# Patient Record
Sex: Male | Born: 1940
Health system: Southern US, Community
[De-identification: ages and names within clinical notes are randomized; demographics above are authoritative.]

## PROBLEM LIST (undated history)

## (undated) DIAGNOSIS — M109 Gout, unspecified: Secondary | ICD-10-CM

## (undated) DIAGNOSIS — N529 Male erectile dysfunction, unspecified: Secondary | ICD-10-CM

## (undated) DIAGNOSIS — Z789 Other specified health status: Secondary | ICD-10-CM

## (undated) DIAGNOSIS — N486 Induration penis plastica: Secondary | ICD-10-CM

## (undated) DIAGNOSIS — E7800 Pure hypercholesterolemia, unspecified: Secondary | ICD-10-CM

## (undated) DIAGNOSIS — H9319 Tinnitus, unspecified ear: Secondary | ICD-10-CM

## (undated) HISTORY — DX: Pure hypercholesterolemia, unspecified: E78.00

## (undated) HISTORY — PX: OTHER SURGICAL HISTORY: SHX169

## (undated) HISTORY — DX: Tinnitus, unspecified ear: H93.19

## (undated) HISTORY — PX: JOINT REPLACEMENT: SHX530

## (undated) HISTORY — DX: Gout, unspecified: M10.9

## (undated) HISTORY — DX: Induration penis plastica: N48.6

## (undated) HISTORY — DX: Male erectile dysfunction, unspecified: N52.9

---

## 2006-06-10 ENCOUNTER — Inpatient Hospital Stay (HOSPITAL_COMMUNITY): Admission: EM | Admit: 2006-06-10 | Discharge: 2006-06-14 | Payer: Self-pay | Admitting: Emergency Medicine

## 2007-08-04 ENCOUNTER — Emergency Department (HOSPITAL_COMMUNITY): Admission: EM | Admit: 2007-08-04 | Discharge: 2007-08-04 | Payer: Self-pay | Admitting: Emergency Medicine

## 2008-07-18 ENCOUNTER — Encounter: Admission: RE | Admit: 2008-07-18 | Discharge: 2008-07-18 | Payer: Self-pay | Admitting: Orthopaedic Surgery

## 2008-08-07 ENCOUNTER — Inpatient Hospital Stay (HOSPITAL_COMMUNITY): Admission: RE | Admit: 2008-08-07 | Discharge: 2008-08-10 | Payer: Self-pay | Admitting: Orthopaedic Surgery

## 2009-11-04 IMAGING — CR DG HIP COMPLETE 2+V*R*
1 series · 1 of 1 positions shown · non-contrast
Comparison: CT the pelvis 07/18/2008

CLINICAL DATA: Right hip hemiarthroplasty

RIGHT HIP - COMPLETE 2+ VIEW

[view not recorded]
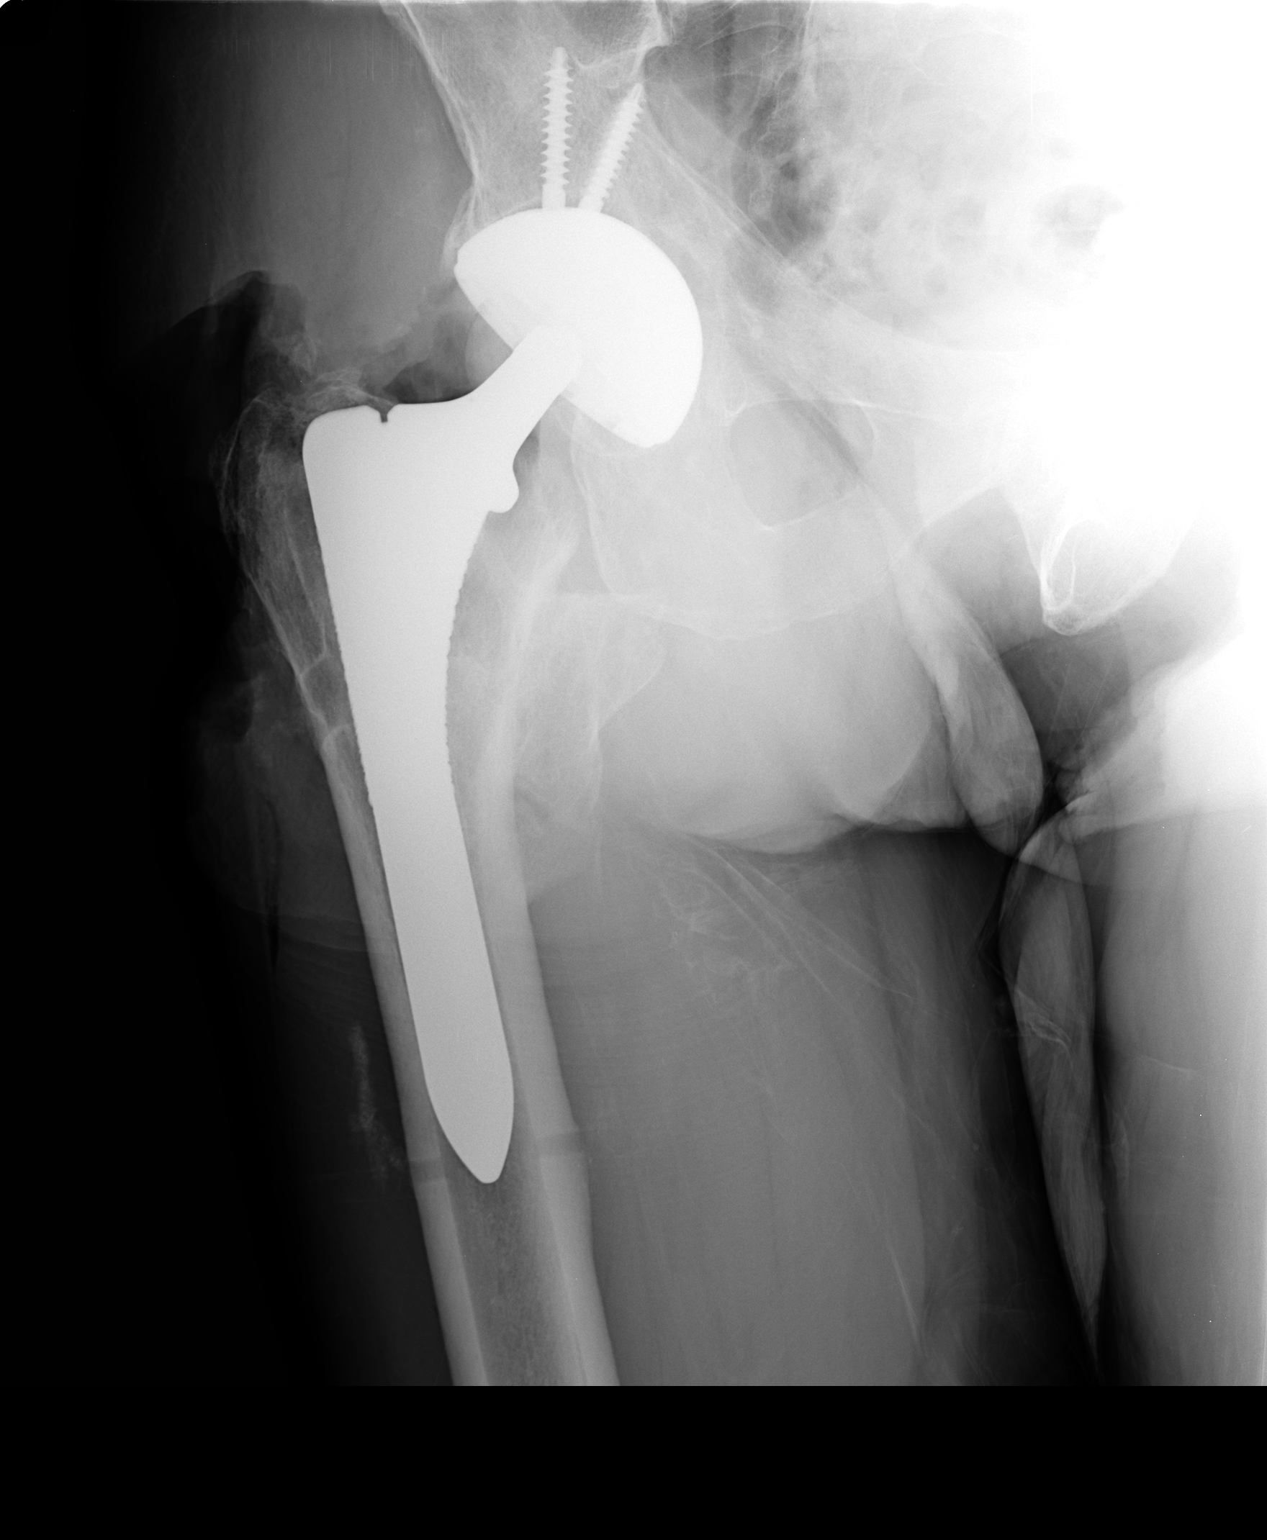

[1 of 1 positions shown; findings below may reference images not displayed]

FINDINGS: Two portable AP views of the right hip were submitted.
There are postsurgical changes of right hip arthroplasty.  Two
screws traverse the acetabulum, into the iliac bone.  Remote healed
intertrochanteric hip fracture deformity is identified.  A tract
from a prior nail in the proximal third of the femur is present.
No evidence of periprosthetic fracture.  The femoral stem component
of the arthroplasty appears well seated.  Expected subcutaneous gas
is noted about the right hip.
IMPRESSION: 1.Postsurgical changes of right hip arthroplasty. No complicating
features.
2.  Remote healed right intertrochanteric fracture deformity.

## 2010-10-27 LAB — CROSSMATCH: Antibody Screen: NEGATIVE

## 2010-10-27 LAB — CBC
HCT: 22.3 % — ABNORMAL LOW (ref 39.0–52.0)
HCT: 27.3 % — ABNORMAL LOW (ref 39.0–52.0)
HCT: 28.9 % — ABNORMAL LOW (ref 39.0–52.0)
HCT: 46.9 % (ref 39.0–52.0)
Hemoglobin: 9.8 g/dL — ABNORMAL LOW (ref 13.0–17.0)
MCHC: 34.9 g/dL (ref 30.0–36.0)
MCHC: 35.6 g/dL (ref 30.0–36.0)
MCV: 90.9 fL (ref 78.0–100.0)
MCV: 93 fL (ref 78.0–100.0)
MCV: 93.7 fL (ref 78.0–100.0)
Platelets: 157 10*3/uL (ref 150–400)
Platelets: 158 10*3/uL (ref 150–400)
Platelets: 212 10*3/uL (ref 150–400)
Platelets: 252 10*3/uL (ref 150–400)
RDW: 13.2 % (ref 11.5–15.5)
RDW: 13.6 % (ref 11.5–15.5)
RDW: 13.7 % (ref 11.5–15.5)

## 2010-10-27 LAB — BASIC METABOLIC PANEL
BUN: 10 mg/dL (ref 6–23)
BUN: 13 mg/dL (ref 6–23)
BUN: 15 mg/dL (ref 6–23)
CO2: 27 mEq/L (ref 19–32)
CO2: 29 mEq/L (ref 19–32)
CO2: 30 mEq/L (ref 19–32)
Calcium: 8.2 mg/dL — ABNORMAL LOW (ref 8.4–10.5)
Calcium: 8.3 mg/dL — ABNORMAL LOW (ref 8.4–10.5)
Chloride: 96 mEq/L (ref 96–112)
Creatinine, Ser: 1.12 mg/dL (ref 0.4–1.5)
GFR calc Af Amer: >60 mL/min (ref >60)
GFR calc non Af Amer: 55 mL/min — ABNORMAL LOW (ref >60)
Glucose, Bld: 128 mg/dL — ABNORMAL HIGH (ref 70–99)
Glucose, Bld: 154 mg/dL — ABNORMAL HIGH (ref 70–99)
Potassium: 4.3 mEq/L (ref 3.5–5.1)
Potassium: 5 mEq/L (ref 3.5–5.1)
Sodium: 132 mEq/L — ABNORMAL LOW (ref 135–145)

## 2010-10-27 LAB — PROTIME-INR: Prothrombin Time: 15.8 seconds — ABNORMAL HIGH (ref 11.6–15.2)

## 2010-11-25 NOTE — Op Note (Signed)
Timothy Stanton, Timothy Stanton               ACCOUNT NO.:  [id number]   MEDICAL RECORD NO.:  [id number]          PATIENT TYPE:  OIB   LOCATION:  5040                         FACILITY:  [facility]   PHYSICIAN:  Lubertha Basque. Dalldorf, M.D.DATE OF BIRTH:  Dec 13, 1940   DATE OF PROCEDURE:  08/07/2008  DATE OF DISCHARGE:                               OPERATIVE REPORT   PREOPERATIVE DIAGNOSIS:  Right hip avascular necrosis.   POSTOPERATIVE DIAGNOSIS:  Right hip avascular necrosis.   PROCEDURES:  1. Right hip removal hardware.  2. Right hip total hip replacement.   ANESTHESIA:  General.   ATTENDING SURGEON:  Lubertha Basque. Jerl Santos, MD   ASSISTANT:  Lindwood Qua, PA   INDICATIONS FOR PROCEDURE:  The patient is a 70 year old man who broke  his hip more than a year ago.  He was treated with a trochanteric nail.  His fracture went onto to heal, but he has persisted with groin pain.  By CT evaluation, he was noted to have collapse of the femoral head  consistent with avascular necrosis, most likely related to his initial  trauma.  He has pain, which limits his ability to rest and walk and  exercise and he is offered a hip replacement.  Informed operative  consent was obtained after discussion of possible complications  including reaction to anesthesia, infection, DVT, dislocation, and  death.   SUMMARY, FINDINGS, AND PROCEDURE:  Under general anesthesia through a  posterior approach and through some separate small incisions, two  procedures were performed.  We first removed the DePuy trochanteric  nail.  We moved the distal screw and the hip screw followed by removal  of the rod, which was fairly difficult, as this had grown over with bone  significantly.  We then performed a hip replacement.  We addressed the  acetabulum with a 60-mm Sector cup.  We did stabilize with 2 screws as  well.  These were 35 mm in length, and we achieved great purchase.  We  placed a +4, 10-degree liner.  On the femoral side, we  had to bypass the  distal screws.  We used an 8-inch, size 18 right Solution stem by DePuy.  We then capped this with a 36, +1.5 hip ball.  He was stable in tested  positions.  We did try to restore the length in his leg, which had been  shortened by the fracture.  Bryna Colander assisted throughout and was  invaluable to the completion of the case in that he helped position and  retract while I performed the procedure.  He also closed simultaneously  to help minimize OR time.   DESCRIPTION OF PROCEDURE:  The patient was taken to the operating suite  where general anesthetic was applied without difficulty.  He was  positioned in the lateral decubitus position with the right hip up.  All  bony prominences were appropriately padded.  Axillary roll was placed,  and hip positioners were utilized.  He was then prepped and draped in  normal sterile fashion.  After the administration of IV Kefzol, we  started the procedure.  All appropriate anti-infective  measures were  used including closed hooded exhaust systems for each member of the  surgical team, Betadine-impregnated drape, and the preoperative IV  antibiotic.  We made some small incisions, removed the locking screw and  the hip screw without significant difficulty.  I did use fluoroscopy  throughout this portion of the case and I read these views myself.  We  then made a posterior approach to the hip.  We split the IT band after  dissection through adipose tissue.  Then, tagged and reflected the short  external rotators.  With some difficulty, we were able to find the  buried trochanteric nail and removed this with several different  instruments.  A femoral neck cut was made after the hip was dislocated.  Attention was turned toward the acetabulum.  Labral structures were  removed followed by reaming medially and reaming up to 57.  We placed a  58 cup, but this did not seem to give [place] adequate fixation.  I  subsequently placed a size 60  Sector cup in appropriate anteversion and  tilt.  This did seem to bite well, but with our previously experience  with a 58, I elected to place 2 screws in a superior direction and  achieved excellent purchase with each.  We then placed a trial liner.  We reamed the femur appropriately in the old intertrochanteric nail  site.  His anatomy had been changed somewhat due to the  intertrochanteric fracture, which healed with abundant callus.  I tried  the Summit stem but did not feel good about my proximal fixation.  Due  to the change in his anatomy, I  elected to go for distal fixation.  We  brought the AML set into the room and reamed appropriately up to an 18.  We placed a 6-inch AML trial and got a x-ray.  The tip of this  prosthesis was right at one of the old screw holes, and I elected to  bypass this with an 8-inch stem.  We subsequently placed an 8-inch right  Solution stem size 18.  We could not antevert this very much, as this  did include a bone for the femur.  With trial reduction, I elected to go  to the 10-degree liner for more stability.  The +4, 10-degree liner was  placed.  We tried various head and neck assemblies and another x-ray to  judge leg length.  I elected to go with a +1.5 hip ball and this was  placed.  He was stable in extension with external rotation and flexion  with internal rotation.  He was fairly tight in full extension.  The  wounds were irrigated followed by reapproximation of the short external  rotators to the greater trochanteric region with nonabsorbable suture.  IT band and gluteus maximus fascia were reapproximated with #1 Vicryl in  interrupted fashion.  We did irrigate prior to closure this layer.  Subcutaneous tissues reapproximated with 0 and 2-0 undyed Vicryl, and  skin was closed with staples.  Adaptic was applied to the various wounds  followed by dry gauze and tape.  Estimated blood loss and intraoperative  fluids obtained from anesthesia  records.   DISPOSITION:  The patient was extubated in the operating room and taken  to the recovery room in stable addition.  He was to be admitted for  Orthopedic Surgery Service for appropriate postop care to include  perioperative antibiotics and Coumadin plus Lovenox for DVT prophylaxis.      Lubertha Basque  Jerl Santos, M.D.  Electronically Signed     PGD/MEDQ  D:  08/07/2008  T:  08/08/2008  Job:  [id number]

## 2010-11-28 NOTE — Op Note (Signed)
Timothy Stanton, Timothy Stanton               ACCOUNT NO.:  [id number]   MEDICAL RECORD NO.:  [id number]          PATIENT TYPE:  INP   LOCATION:  2550                         FACILITY:  [facility]   PHYSICIAN:  Lubertha Basque. Dalldorf, M.D.DATE OF BIRTH:  09-30-1940   DATE OF PROCEDURE:  06/10/2006  DATE OF DISCHARGE:                               OPERATIVE REPORT   PREOPERATIVE DIAGNOSIS:  Right hip intertrochanteric fracture.   POSTOPERATIVE DIAGNOSIS:  Right hip intertrochanteric fracture.   PROCEDURE:  Right hip trochanteric nail.   ANESTHESIA:  General.   ATTENDING SURGEON:  Lubertha Basque. Jerl Santos, M.D.   ASSISTANT:  Lindwood Qua, P.A.   INDICATIONS FOR PROCEDURE:  The patient is a 70 year old man who slipped  today at the gym and sustained a displaced intertrochanteric fracture of  his right hip.  He is offered ORIF in hopes of stabilizing this fracture  and allowing it to heal and potentially to allow him to walk and run  again.  Informed operative consent was obtained after discussion of  possible complications of reaction to anesthesia, infection, DVT, PE,  and death.   SUMMARY OF FINDINGS AND PROCEDURE:  Under general anesthesia, a right  hip intertrochanteric fracture was reduced and then stabilized with a  DePuy intertrochanteric nail.  This was a 130 degree 13 x 200  trochanteric nail stabilized proximally with 115 mm long lag screw and  distally with one locking screw.  Bryna Colander assisted throughout and  was invaluable to the completion of the case in that he helped position  and retract and pass instruments while I performed the operation.  He  also closed simultaneously to help minimize OR time.  I used fluoroscopy  throughout the case to make appropriate intraoperative decisions and  read all these views myself.   DESCRIPTION OF PROCEDURE:  The patient was taken to the operating suite  where general anesthetic was applied without difficulty.  He was  positioned on the  fracture table and appropriately padded.  The fracture  was reduced under fluoroscopic guidance with some gentle traction.  He  was then prepped and draped in normal sterile fashion.  After  administration of preoperative IV Kefzol, a guidewire was placed through  a small incision into the greater trochanteric region.  This was then  over-reamed with a starter reamer.  We then placed the 13 mm x 200 mm  130 degree nail without much difficulty.  I then locked this proximally  through a separate incision using the guide.  This screw was 115 mm in  length.  He had excellent bone quality.  I locked the rod distally with  one locking screw.  Reduction of fracture and placement of hardware was  confirmed fluoroscopically to be appropriate.  The wounds were irrigated  followed by reapproximation of deep tissues with the 0 Vicryl and  subcutaneous tissues with 2-0 undyed Vicryl.  Skin was closed with  staples.  Adaptic was applied to the wounds followed by dry gauze and  tape.  Estimated blood loss and intraoperative fluids can be obtained  from anesthesia records.   DISPOSITION:  The patient was extubated the operating room and taken to  recovery in stable addition.  He was to be admitted for the orthopedic  surgery service for appropriate postop care to include perioperative  antibiotics and Coumadin plus Lovenox for DVT prophylaxis.      Lubertha Basque Jerl Santos, M.D.  Electronically Signed     PGD/MEDQ  D:  06/10/2006  T:  06/11/2006  Job:  [id number]

## 2010-11-28 NOTE — Discharge Summary (Signed)
Timothy Stanton, Timothy Stanton               ACCOUNT NO.:  [id number]   MEDICAL RECORD NO.:  [id number]          PATIENT TYPE:  INP   LOCATION:  5040                         FACILITY:  [facility]   PHYSICIAN:  Lubertha Basque. Dalldorf, M.D.DATE OF BIRTH:  [birth year]   DATE OF ADMISSION:  08/07/2008  DATE OF DISCHARGE:  08/10/2008                               DISCHARGE SUMMARY   PREOPERATIVE DIAGNOSES:  1. 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  2. Hypertension.   DISCHARGE DIAGNOSES:  1. 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  2. Hypertension.  3. Blood loss anemia.   BRIEF HISTORY:  Timothy Stanton is a 70 year old white male who originally  fractured his hip and had a troch nail put in on August 10, 2005.  He  has gone on to have right hip DJD with AVN and having continued  discomfort in his hip when he ambulates and we have done studies that  revealed this and I have discussed with him treatment options that being  conversion to a hip replacement.   PERTINENT LABORATORY DATA AND X-RAY FINDINGS:  Blood was replaced as  necessary. At the time of this dictation, not all laboratory data is  available in this chart, but I can tell you that postoperatively.  On  his first day postop, his hemoglobin was 9.5, INR was 1.2.  On the  second day postop, his hemoglobin 7.9, INR 1.8, and blood was replaced  as necessary on that date.   HOSPITAL COURSE:  He was admitted postoperatively, placed on a variety  of p.o. and IM analgesics for pain including an IV PCA pump reduced-  protocol dose.  He was kept on his home medicines which will be outlined  at the end of this dictation.  Pharmacy for Coumadin and Lovenox DVT  prophylaxis protocol as well as vancomycin as the perioperative  antibiotic.  He was given various additional medications including  ferrous sulfate, oral pain medicines, antiemetics, muscle relaxers.  Foley catheter was used for the first day and then  discontinued, knee-  high TEDs, incentive spirometry, then activity which was helped by  Physical Therapy, touchdown weightbearing.  On the first day postop, his  wound was benign.  His blood pressure was 96/57 and temperature 97.  Lungs were clear.  Abdomen was soft.  Foley catheter was discontinued.  On the second day postop, he had a decreased hemoglobin and was given 2  units of packed RBCs.  Dressing was changed.  Wound was noted to be  benign.  He was voiding fine.  No difficulties with positive bowel  sounds and breath sounds in all lung fields.  On the third day postop,  he was eating, voiding and was discharged home.   DISCHARGE CONDITION:  Improved.   FOLLOWUP:  He will remain on multivitamin, simvastatin 20 mg 1 a day,  and vitamin E and vitamin C daily.  Coumadin dose regulated by pharmacy  for 4 weeks and then when he is at home regulated by [place].  Genev[facility]lander  will also provide home physical therapy.  He may change his  dressing  daily, be touchdown weightbearing on this leg with crutches or walker.  No driving or lifting.  Low-sodium, heart-healthy diet.      Lindwood Qua, P.A.      Lubertha Basque Jerl Santos, M.D.  Electronically Signed    MC/MEDQ  D:  09/11/2008  T:  09/12/2008  Job:  [id number]

## 2010-11-28 NOTE — Discharge Summary (Signed)
NAMEWEN, Timothy Stanton               ACCOUNT NO.:  [id number]   MEDICAL RECORD NO.:  [id number]          PATIENT TYPE:  INP   LOCATION:  5007                         FACILITY:  [facility]   PHYSICIAN:  Timothy Basque. Dalldorf, M.D.DATE OF BIRTH:  1941-02-08   DATE OF ADMISSION:  06/10/2006  DATE OF DISCHARGE:  06/14/2006                               DISCHARGE SUMMARY   ADMITTING DIAGNOSES:  1. Right hip displaced intertrochanteric fracture.  2. Hyperlipidemia.   DISCHARGE DIAGNOSIS:  1. Right hip displaced intertrochanteric fracture.  2. Hyperlipidemia.   OPERATIONS:  Right hip trochanteric nail.   BRIEF HISTORY:  Timothy Stanton was at a local exercise facility the day of  admission to the hospital.  When he was coming out the shower the floor  was wet and he slipped and fell, injuring his right hip.  He was unable  to bear weight and was transported to the hospital, at which time x-rays  noted an intertrochanteric fracture of his right hip.  We had discussed  treatment options with him that being ORIF with the risks of anesthesia,  infection, DVT and possible death.   PERTINENT LABORATORY AND X-RAY FINDINGS:  He had sinus bradycardia on an  EKG and bronchitic changes on his chest x-ray.  WBCs 10.1, hemoglobin  11.4, platelets 189.  Serial INRs were done, as he was on low-dose  Coumadin protocol, the last one 2.5.  Sodium 141, potassium 4.4, glucose  129, BUN 11, creatinine 1.0, calcium 8.7.   COURSE IN THE HOSPITAL:  He was admitted through the emergency room and  taken to the operating room for the above-mentioned operation.  Postoperatively, he was on a reduced dose morphine PCA pump, IV of  lactated Ringers, Ancef 1 gm q.8h. x3 doses, pharmacy for Coumadin  dosing and low-dose DVT prophylaxis and total hip Lovenox protocol to  start the next day at 10 a.m.  He was given oral medications of  Percocet, Tylenol as needed.  He could be touchdown to partial  weightbearing, Reglan for nausea,  knee-high TEDs, incentive spirometry,  muscle relaxers (i.e Robaxin), and follow up laboratory studies were  done.  The first day postoperatively, his vital signs were stable, lungs  were clear, abdomen was soft.  His hematocrit was 36.3, INR 1.1,  potassium 4.4.  The subsequent days in the hospital, he had to work with  a physical therapist.  His dressing was changed the second day  postoperatively.  Foley catheter was discontinued.  His IV and PCA were  discontinued.  His wound was noted to be benign on dressing change.  He  progressed well with physical therapy and was discharged home on  June 14, 2006.   CONDITION ON DISCHARGE:  Improved.   We made arrangements for a hospital bed at home due to his bedroom being  upstairs without access, and he was given 3 prescriptions, Coumadin dose  per pharmacy, which initially started off at 5 mg daily until his INR  would be checked by a home agency, which would also provide physical  therapy, a walker and hospital bed.  Restoril 15  mg  one at night time p.r.n. sleep and Percocet 1 or 2 every 4-6 hours if  needed.  He may change his dressing daily, be partial weightbearing.  If  any sign of infection, he is to call our office, [phone], and we would  see him back in the office in 7-10 days.  His diet is unrestricted.  He  can use crutches or walker to ambulate.      Timothy Stanton, P.A.      Timothy Stanton, M.D.  Electronically Signed    MC/MEDQ  D:  08/10/2006  T:  08/10/2006  Job:  [id number]

## 2011-04-02 LAB — I-STAT 8, (EC8 V) (CONVERTED LAB)
Acid-Base Excess: 2
BUN: 13
Bicarbonate: 24
Chloride: 112
HCT: 48
Hemoglobin: 16.3
Operator id: 272551
Sodium: 141
pCO2, Ven: 29.8 — ABNORMAL LOW

## 2011-04-02 LAB — CBC
Hemoglobin: 15.6
MCHC: 34.8
RBC: 4.92
RDW: 12.7

## 2011-04-02 LAB — DIFFERENTIAL
Basophils Absolute: 0
Basophils Relative: 0
Lymphocytes Relative: 32
Neutro Abs: 3.7
Neutrophils Relative %: 56

## 2011-04-02 LAB — URINE MICROSCOPIC-ADD ON

## 2011-04-02 LAB — URINALYSIS, ROUTINE W REFLEX MICROSCOPIC
Glucose, UA: NEGATIVE
Ketones, ur: NEGATIVE
Leukocytes, UA: NEGATIVE
Protein, ur: NEGATIVE
pH: 5

## 2011-04-02 LAB — POCT I-STAT CREATININE: Creatinine, Ser: 1.2

## 2012-12-12 ENCOUNTER — Other Ambulatory Visit: Payer: Self-pay | Admitting: Gastroenterology

## 2013-01-12 ENCOUNTER — Encounter (HOSPITAL_COMMUNITY): Payer: Self-pay | Admitting: *Deleted

## 2013-01-16 ENCOUNTER — Encounter (HOSPITAL_COMMUNITY): Payer: Self-pay | Admitting: Pharmacy Technician

## 2013-01-31 ENCOUNTER — Ambulatory Visit (HOSPITAL_COMMUNITY)
Admission: RE | Admit: 2013-01-31 | Payer: PRIVATE HEALTH INSURANCE | Source: Ambulatory Visit | Admitting: Gastroenterology

## 2013-01-31 HISTORY — DX: Other specified health status: Z78.9

## 2013-01-31 SURGERY — COLONOSCOPY WITH PROPOFOL
Anesthesia: Monitor Anesthesia Care

## 2014-12-25 ENCOUNTER — Ambulatory Visit (INDEPENDENT_AMBULATORY_CARE_PROVIDER_SITE_OTHER): Payer: PPO | Admitting: Podiatry

## 2014-12-25 ENCOUNTER — Ambulatory Visit (INDEPENDENT_AMBULATORY_CARE_PROVIDER_SITE_OTHER): Payer: PPO

## 2014-12-25 ENCOUNTER — Encounter: Payer: Self-pay | Admitting: Podiatry

## 2014-12-25 VITALS — BP 144/83 | HR 59 | Temp 99.0°F | Resp 14

## 2014-12-25 DIAGNOSIS — M722 Plantar fascial fibromatosis: Secondary | ICD-10-CM

## 2014-12-25 DIAGNOSIS — M7662 Achilles tendinitis, left leg: Secondary | ICD-10-CM | POA: Diagnosis not present

## 2014-12-25 MED ORDER — MELOXICAM 15 MG PO TABS: 15.0000 mg | ORAL_TABLET | Freq: Every day | ORAL | Status: DC

## 2014-12-25 NOTE — Progress Notes (Signed)
   Subjective:    Patient ID: Timothy Stanton, male    DOB: [birth year], 74 y.o.   MRN: [id number]  HPI  This patient presents to the office with painful left foot in his back heel.  [place] painful area in the back left heel.  It improved and then worsened again.  He presents with pain which affects his walking.  He has tried motrin but problems persists.  He presents for evaluatyion and treatment.    Review of Systems  HENT: Positive for hearing loss.        Ringing in ears  Genitourinary: Positive for frequency.  Musculoskeletal:       Muscle pain       Objective:   Physical Exam Objective: Review of past medical history, medications, social history and allergies were performed.  Vascular: Dorsalis pedis and posterior tibial pulses were palpable B/L, capillary refill was  WNL B/L, temperature gradient was WNL B/L   Skin:  No signs of symptoms of infection or ulcers on both feet  Nails: appear healthy with no signs of mycosis or infections  Sensory: Semmes Weinstein monifilament WNL   Orthopedic: Orthopedic evaluation demonstrates all joints distal t ankle have full ROM without crepitus, muscle power WNL B/L.  Red swollen increased temperature at the insertion achilles tendon left foot.  Muscle strength intact.        Assessment & Plan:  Achilles Tendinitis Left foot  IE  Xray  Prescription for Mobic.

## 2015-01-08 ENCOUNTER — Ambulatory Visit: Payer: Self-pay | Admitting: Podiatry

## 2015-09-02 ENCOUNTER — Ambulatory Visit (INDEPENDENT_AMBULATORY_CARE_PROVIDER_SITE_OTHER): Payer: PPO

## 2015-09-02 ENCOUNTER — Encounter: Payer: Self-pay | Admitting: Podiatry

## 2015-09-02 ENCOUNTER — Ambulatory Visit (INDEPENDENT_AMBULATORY_CARE_PROVIDER_SITE_OTHER): Payer: PPO | Admitting: Podiatry

## 2015-09-02 VITALS — BP 146/91 | HR 56 | Resp 18

## 2015-09-02 DIAGNOSIS — M779 Enthesopathy, unspecified: Secondary | ICD-10-CM | POA: Diagnosis not present

## 2015-09-02 DIAGNOSIS — R52 Pain, unspecified: Secondary | ICD-10-CM | POA: Diagnosis not present

## 2015-09-02 MED ORDER — MELOXICAM 15 MG PO TABS: 15.0000 mg | ORAL_TABLET | Freq: Every day | ORAL | Status: DC

## 2015-09-02 MED ORDER — METHYLPREDNISOLONE 4 MG PO TBPK: ORAL_TABLET | ORAL | Status: DC

## 2015-09-02 NOTE — Patient Instructions (Signed)
Start by taking the medrol dose pack (steroid) and once complete can take the anti-inflammatory (mobic). Do not take both at the same time.

## 2015-09-04 ENCOUNTER — Encounter: Payer: Self-pay | Admitting: Podiatry

## 2015-09-04 NOTE — Progress Notes (Signed)
Subjective:     Patient ID: Timothy Stanton, male   DOB: 08/14/1940, 75 y.o.   MRN: [id number]  HPI  75 year old male presents the office with concerns of swelling and pain to the right foot along the first MTPJ which has been ongoing for approximate one week. He states it hurts more on the bottom. He is tried over-the-counter inserts which seem to make the area worse. He denies any recent injury or trauma. No history of gout. He's had no other treatment. No tingling or numbness. No other complaints.  Review of Systems  All other systems reviewed and are negative.      Objective:   Physical Exam General: AAO x3, NAD  Dermatological: Skin is warm, dry and supple bilateral. Nails x 10 are well manicured; remaining integument appears unremarkable at this time. There are no open sores, no preulcerative lesions, no rash or signs of infection present.  Vascular: Dorsalis Pedis artery and Posterior Tibial artery pedal pulses are 2/4 bilateral with immedate capillary fill time. Pedal hair growth present. No varicosities and no lower extremity edema present bilateral. There is no pain with calf compression, swelling, warmth, erythema.   Neruologic: Grossly intact via light touch bilateral. Vibratory intact via tuning fork bilateral. Protective threshold with Semmes Wienstein monofilament intact to all pedal sites bilateral. Patellar and Achilles deep tendon reflexes 2+ bilateral. No Babinski or clonus noted bilateral.   Musculoskeletal:  Tenderness of the first MTPJ on the right side with overlying swelling without any significant erythema. There is mild pain with first MTPJ range of motion. There is no areas of fluctuance or crepitus. No open lesions. No other areas of tenderness bilateral lower extremities.   Gait: Unassisted, Nonantalgic.      Assessment:      75 year old male right first MTPJ capsulitis    Plan:     -X-rays were obtained and reviewed with the patient.  -Treatment options  discussed including all alternatives, risks, and complications -Etiology of symptoms were discussed -Prescribed Medrol Dosepak. Once it is complete he can start meloxicam which are both ordered today. Discussed several injection would wishes to hold off. -Hold off on orthotics of the symptomatic area worse. Continue supportive shoe gear. Impression the first MTPJ. -Follow-up as scheduled. Call if questions concerns meantime.  Celesta Gentile, DPM

## 2015-09-23 ENCOUNTER — Encounter: Payer: Self-pay | Admitting: Podiatry

## 2015-09-23 ENCOUNTER — Ambulatory Visit (INDEPENDENT_AMBULATORY_CARE_PROVIDER_SITE_OTHER): Payer: PPO | Admitting: Podiatry

## 2015-09-23 VITALS — BP 137/89 | HR 52 | Resp 14

## 2015-09-23 DIAGNOSIS — M722 Plantar fascial fibromatosis: Secondary | ICD-10-CM

## 2015-09-23 DIAGNOSIS — M779 Enthesopathy, unspecified: Secondary | ICD-10-CM | POA: Diagnosis not present

## 2015-09-23 DIAGNOSIS — B351 Tinea unguium: Secondary | ICD-10-CM | POA: Diagnosis not present

## 2015-09-23 NOTE — Progress Notes (Signed)
Patient ID: Timothy Stanton, male   DOB: 08-Jul-1941, 75 y.o.   MRN: [id number]  Subjective: 75 year old male presents the office for follow-up evaluation of right first MTPJ capsulitis and pain. He states his last upon of the pain is significantly improved and he has very minimal pain to no pain at all. He gets some occasional discomfort of the big toe views do a lot of walking but greatly improved. After last appointment he started to develop some heel pain as he is walking differently however that has resolved. He was also asking about possible treatment options for nail fungus. Denies any pain or swelling or any drainage from the toenails. Denies any systemic complaints such as fevers, chills, nausea, vomiting. No acute changes since last appointment, and no other complaints at this time.   Objective: AAO x3, NAD DP/PT pulses palpable bilaterally, CRT less than 3 seconds This time there is no tenderness palpation to the right first MTPJ in the edema appears to be greatly improved and there is only small mild trace edema residual. There is no erythema or increase in warmth. No restriction first MTPJ range of motion today. No pain with range of motion. There is no pain to the plantar medial tubercle of the calcaneus at insertion the plantar fascial there is no pain with medial to lateral compression. No other areas of tenderness to bilateral lower extremity is. No areas of pinpoint bony tenderness or pain with vibratory sensation. MMT 5/5, ROM WNL. No edema, erythema, increase in warmth to bilateral lower extremities.  No open lesions or pre-ulcerative lesions.  Nails appear to be somewhat dystrophic, discolored, hypertrophic. There is no tenderness the nails no surrounding redness or drainage. No pain with calf compression, swelling, warmth, erythema  Assessment: Resolving right first MTPJ capsulitis, onychomycosis  Plan: -All treatment options discussed with the patient including all  alternatives, risks, complications.  -Continues once for any recurrence of pain to the right first MTPJ. Continue stretching to the plantar fasciitis which she's been doing. Supportive shoe gear discussed. -Discuss treatment after onychomycosis. He states he will tried over-the-counter topical before proceeding with any other medicines. -Follow-up as needed. -Patient encouraged to call the office with any questions, concerns, change in symptoms.   Celesta Gentile, DPM

## 2015-09-30 DIAGNOSIS — K1370 Unspecified lesions of oral mucosa: Secondary | ICD-10-CM | POA: Diagnosis not present

## 2015-10-04 ENCOUNTER — Ambulatory Visit (INDEPENDENT_AMBULATORY_CARE_PROVIDER_SITE_OTHER): Payer: PPO | Admitting: Podiatry

## 2015-10-04 ENCOUNTER — Other Ambulatory Visit: Payer: Self-pay | Admitting: Podiatry

## 2015-10-04 VITALS — BP 157/84 | HR 79 | Resp 18

## 2015-10-04 DIAGNOSIS — M779 Enthesopathy, unspecified: Secondary | ICD-10-CM

## 2015-10-04 DIAGNOSIS — M109 Gout, unspecified: Secondary | ICD-10-CM | POA: Diagnosis not present

## 2015-10-05 LAB — URIC ACID: URIC ACID, SERUM: 7.4 mg/dL (ref 4.0–7.8)

## 2015-10-05 LAB — C-REACTIVE PROTEIN: CRP: 3.6 mg/dL — ABNORMAL HIGH (ref <0.60)

## 2015-10-05 LAB — SEDIMENTATION RATE: Sed Rate: 11 mm/hr (ref 0–20)

## 2015-10-07 ENCOUNTER — Telehealth: Payer: Self-pay | Admitting: *Deleted

## 2015-10-07 ENCOUNTER — Encounter: Payer: Self-pay | Admitting: Podiatry

## 2015-10-07 NOTE — Progress Notes (Signed)
Patient ID: Timothy Stanton, male   DOB: 23-Dec-1940, 75 y.o.   MRN: [id number]  Subjective: 75 year old male presents the office today. Evaluation of right first MTPJ pain. He states that after last appointment he was doing well and having no pain however the pain didn't start to recur. He believes that he has gout. Since the pain to the first MTPJ started he started getting heel pain which has been resolving. No recent injury or trauma. No tingling or numbness. He has continued with the meloxicam which seems to help. He finish course of Medrol Dosepak. Denies any systemic complaints such as fevers, chills, nausea, vomiting. No acute changes since last appointment, and no other complaints at this time.   Objective: AAO x3, NAD DP/PT pulses palpable bilaterally, CRT less than 3 seconds Protective sensation intact with Simms Weinstein monofilament, There does continue be some mild edema to the right first MTPJ all but does appear to be improved. There is no significant erythema or increase in warmth. There is no pain with first MTPJ range of motion. There is tenderness along the sesamoids plantarly. No other areas of tenderness to bilateral lower extremities. MMT 5/5, ROM WNL. No edema, erythema, increase in warmth to bilateral lower extremities.  No open lesions or pre-ulcerative lesions.  No pain with calf compression, swelling, warmth, erythema  Assessment: First MTPJ capsulitis, sesamoiditis  Plan: -All treatment options discussed with the patient including all alternatives, risks, complications.  -X-rays were obtained and reviewed with the patient. No evidence of acute fracture stress fracture. Likely capsulitis. -If symptoms continue will immobilize for other pathology. -Continue anti-inflammatories for now. -Ordered blood work (Uric acid, ESR, CRP) -I discussed steroid injections and wishes to hold off. Also discussed other treatment options and he wishes to hold off as his symptoms appear  to be improving slowly. -Offloading pads. -Patient encouraged to call the office with any questions, concerns, change in symptoms.   Celesta Gentile, DPM

## 2015-10-08 NOTE — Telephone Encounter (Signed)
I saw him last week and wanted to do an injection or change medication and he declined and said he would continue with the mobic as it did get better then started again.   Can add colchicine 0.6mg  daily x 10 days. Follow-up then. Also, plantar fascia stretching; ice to the heel, wear sneaker.   He can follow-up with Dr. Cannon Kettle for a 2nd opinion if he would like but I am more than happy to continue seeing him.

## 2015-10-08 NOTE — Telephone Encounter (Addendum)
Pt called for blood work results.  Dr. Jacqualyn Posey states slightly elevated CRP, unspecific for gout, but feels it is gout, and pt should continue the meloxicam.  I informed pt of the results and orders, pt states he has been on the meloxicam long enough and he does not feel it is working, and he has been to our office 3 times and he still has not gotten any relief.  10/09/2015-Pt called request explanation of his lab work and what he should be doing next.  I explained that his C-Reactive Protein was elevated and although it was an indicator of inflammation it did not indicate where the inflammation was or the cause, where as high uric acid was an indicator of gout and his was 7.4 with 7.8 being high normal.  Pt asked what we would do as his next form of treatment if he had an appt.  I told pt Dr. Jacqualyn Posey had offered an injection of steroids into the affected joint and putting the antiinflammatory property of the steroid on the specific problem area often helped or adding Colchicine to his therapy with antiinflammatory medication.  I told pt he should continue the stretches and ice to the heel because his two problems would make each other miserable.  Pt states he wants to take the colchicine and Mobic, cancel the scheduled appt and see how he does.  I told pt I would take care of the orders and cancel the appt.  I told pt to call with concerns.

## 2015-10-18 ENCOUNTER — Ambulatory Visit: Payer: PPO | Admitting: Podiatry

## 2015-10-29 ENCOUNTER — Other Ambulatory Visit: Payer: Self-pay | Admitting: Podiatry

## 2015-10-29 NOTE — Telephone Encounter (Signed)
Pt needs to make an appt if continuing pain.

## 2016-01-28 ENCOUNTER — Encounter: Payer: Self-pay | Admitting: Neurology

## 2016-02-18 ENCOUNTER — Encounter: Payer: Self-pay | Admitting: Neurology

## 2016-02-18 ENCOUNTER — Ambulatory Visit (INDEPENDENT_AMBULATORY_CARE_PROVIDER_SITE_OTHER): Payer: PPO | Admitting: Neurology

## 2016-02-18 VITALS — BP 150/72 | HR 78 | Resp 16 | Ht 70.0 in | Wt 180.0 lb

## 2016-02-18 DIAGNOSIS — R253 Fasciculation: Secondary | ICD-10-CM | POA: Diagnosis not present

## 2016-02-18 DIAGNOSIS — G8929 Other chronic pain: Secondary | ICD-10-CM | POA: Diagnosis not present

## 2016-02-18 DIAGNOSIS — G253 Myoclonus: Secondary | ICD-10-CM

## 2016-02-18 DIAGNOSIS — R29898 Other symptoms and signs involving the musculoskeletal system: Secondary | ICD-10-CM

## 2016-02-18 DIAGNOSIS — G5601 Carpal tunnel syndrome, right upper limb: Secondary | ICD-10-CM

## 2016-02-18 DIAGNOSIS — M6289 Other specified disorders of muscle: Secondary | ICD-10-CM | POA: Diagnosis not present

## 2016-02-18 DIAGNOSIS — M542 Cervicalgia: Secondary | ICD-10-CM | POA: Diagnosis not present

## 2016-02-18 NOTE — Patient Instructions (Addendum)
You have have recent extensive blood work. I don't think we need to add any blood work at this time. Nevertheless, would like to proceed with further testing in the form of EMG and nerve conduction velocity test, which is an electrical nerve and muscle test, which we will schedule. We will call you with the results.  We will do a cervical spine (i.e. neck) MRI to look for degenerative changes vs. Cord compression, vs. Herniated disc. We will call you with the results.

## 2016-02-18 NOTE — Progress Notes (Signed)
Subjective:    Patient ID: [name] is a 75 y.o. male.  HPI     Star Age, MD, PhD [facility] [address] [place], [place] [address]  Dear Dr. Inda Merlin,   I saw your patient, Timothy Stanton, upon your kind request in my neurologic clinic today for initial consultation of his muscle twitching and weakness in his right hand, concern for fasciculations. The patient is accompanied by his wife today. As you know, Timothy Stanton is a 75 year old right-handed gentleman with an underlying medical history of hyperlipidemia, hearing loss, mitral valve prolapse, ED, degenerative joint disease, diverticulosis, tinnitus, and remote history of shingles of the right shoulder in the 1990s, arthritis, status post hip surgery including total hip replacement, who reports muscle cramps in various areas of his body for the past few years. Also smaller muscle twitching around different areas of his body including thighs, calves, forearms, biceps areas. He has noted weakness in his right hand, and problems with dexterity, noticed muscle wasting in his right hand primarily. He has no history of fall, no head injury, no TIA type symptoms, denies shortness of breath, chest pain, swallowing difficulty speech difficulty, memory loss. He reports difficulty with sleep which has been a chronic, nearly lifelong problem. He used to snore but does not snore as much any longer according to wife, he has intentionally been working on weight loss. He has reduced his alcohol intake. He has never been a heavy drinker. He has no history of diabetes. Family history is significant for polio and his older brother who is now 75 years old and has also diabetes. His younger brother is 75 years old and has a history of childhood epilepsy and was apparently recently diagnosed with Parkinson's disease. His father lived to be into his 25s and died of complications of his kidney disease. His mother lived  to be in her late 75s and died of complications of choking. She also was diagnosed with dementia. He has 3 children from his prior marriage, son 75, son 67, daughter 75. He is active and spends a lot of time in the mountains, is physically quite active and out in the woods, mowing the lawn etc. He tries to drink enough water but may not always do a good enough job with that he admits. He likes iced tea about 2 glasses per day, does not drink coffee, and does not drink alcohol daily. His wife is noted an abnormal neck tilt or tendency to look down when he walks, he has noted some neck stiffness and some pain in his neck for the past year. He has no tremors. Symptoms started gradually and he noticed cramping when he had coughing, this would result in severe cramping under his rib cage. Sometimes he has nocturnal cramps in his legs. He does not have a tendency to stagger and has not fallen recently. Symptoms have been progressive but lately he feels he may have plateaued. He does have a history of diplopia for which he was given prisms, this improved. He wears prism eye glasses on the right. I reviewed your office note from 01/28/2016 which you kindly included. Recent laboratory tests in your office included testosterone level, CMP, CBC with differential, PSA, aldolase, CK, sedimentation rate, CRP, SPEP, TSH, lipid panel, and EKG. I reviewed lab test results from your office which included normal CBC with differential with the exception of hemoglobin of 17.5 and hematocrit borderline at 52.9 elevated. Sedimentation rate was 1,  CRP 1.8, CK 113, other results pending, we will request the remaining test results from your office.  His Past Medical History Is Significant For: Past Medical History:  Diagnosis Date  . ED (erectile dysfunction)   . Gout   . Hypercholesteremia   . Medical history non-contributory   . Peyronie disease   . Tinnitus     His Past Surgical History Is Significant For: Past Surgical  History:  Procedure Laterality Date  . JOINT REPLACEMENT Right 4 yrs ago  . right hip pinning   6 1/2 yrs ago    His Family History Is Significant For: Family History  Problem Relation Age of Onset  . Mental illness Mother   . Renal Disease Father     His Social History Is Significant For: Social History   Social History  . Marital status: Married    Spouse name: N/A  . Number of children: 3  . Years of education: HS   Occupational History  . Retired    Social History Main Topics  . Smoking status: Former Research scientist (life sciences)  . Smokeless tobacco: Never Used     Comment: Quit [id number]  . Alcohol use Yes     Comment: wine 1 -2 glasses day  . Drug use: No  . Sexual activity: Not Asked   Other Topics Concern  . None   Social History Narrative   Drinks 7-10 cups of tea a week     His Allergies Are:  Allergies  Allergen Reactions  . Penicillins Other (See Comments)    Fever yrs ago  :   His Current Medications Are:  Outpatient Encounter Prescriptions as of 02/18/2016  Medication Sig  . aspirin EC 81 MG tablet Take 81 mg by mouth daily.  . Cholecalciferol (VITAMIN D3) 2000 units TABS Take by mouth.  . Coenzyme Q10 100 MG capsule Take 100 mg by mouth daily.  . Multiple Vitamin (MULTIVITAMIN WITH MINERALS) TABS Take 1 tablet by mouth daily.  . sildenafil (VIAGRA) 100 MG tablet Take 100 mg by mouth daily as needed for erectile dysfunction.  . [DISCONTINUED] simvastatin (ZOCOR) 40 MG tablet Take 40 mg by mouth daily. 40 mg tablet every other day   No facility-administered encounter medications on file as of 02/18/2016.   :   Review of Systems:  Out of a complete 14 point review of systems, all are reviewed and negative with the exception of these symptoms as listed below:  Review of Systems  Constitutional: Positive for fatigue.  Neurological:       Patient reports that he has had whole body muscle cramps for the past couple of years. They are not any worse, he is curious to know  the cause.  Some muscle weakness reported.     Objective:  Neurologic Exam  Physical Exam Physical Examination:   Vitals:   02/18/16 1047  BP: (!) 150/72  Pulse: 78  Resp: 16    General Examination: The patient is a very pleasant 75 y.o. male in no acute distress. He appears well-developed and well-nourished and well groomed.   HEENT: Normocephalic, atraumatic, pupils are equal, round and reactive to light and accommodation. Funduscopic exam is normal with sharp disc margins noted. Extraocular tracking is good without limitation to gaze excursion or nystagmus noted. Normal smooth pursuit is noted. Hearing is grossly intact. Tympanic membranes are clear bilaterally. Face is symmetric with normal facial animation and normal facial sensation. Speech is clear with no dysarthria noted. There is no hypophonia. There is  no lip, neck/head, jaw or voice tremor. Neck is Fairly supple, maybe mild rigidity noted, some decrease in active neck turned to the left, mild forward tilt in the neck, no carotid bruits on auscultation. Oropharynx exam reveals: mild mouth dryness, adequate dental hygiene. Tongue protrudes centrally and palate elevates symmetrically.    Chest: Clear to auscultation without wheezing, rhonchi or crackles noted.  Heart: S1+S2+0, regular and normal without murmurs, rubs or gallops noted.   Abdomen: Soft, non-tender and non-distended with normal bowel sounds appreciated on auscultation.  Extremities: There is no pitting edema in the distal lower extremities bilaterally. Pedal pulses are intact. He does have thenar wasting and mild intrinsic hand muscle wasting bilaterally, right more prominent than left.  Skin: Warm and dry without trophic changes noted. There are no varicose veins. Feet and toes are slightly purplish in color, slightly cooler to touch, but he has good pulses.  Musculoskeletal: exam reveals no obvious joint deformities, tenderness or joint swelling or erythema.    Neurologically:  Mental status: The patient is awake, alert and oriented in all 4 spheres. His immediate and remote memory, attention, language skills and fund of knowledge are appropriate. There is no evidence of aphasia, agnosia, apraxia or anomia. Speech is clear with normal prosody and enunciation. Thought process is linear. Mood is normal and affect is normal.  Cranial nerves II - XII are as described above under HEENT exam. In addition: shoulder shrug is normal with equal shoulder height noted. Motor exam: Overall fairly lean or thin bulk noted even, thigh muscles appear thinner but he does not believe he has lost any muscle bulk in his thighs, he does have muscle wasting in his thenar muscles and intrinsic hand muscles on the right, tone is fairly normal Normal bulk, strength and tone is noted. There is no drift, tremor or rebound. Romberg is negative. He has fasciculations in both thigh muscles, both hamstring muscles, in the calves bilaterally, and the biceps areas. Sometimes more wider spread muscle contractions almost like myokymia?  Reflexes are 3+ in the biceps, 2+ in the brachial radialis bilaterally, 2-3+ in both knees, trace or absent in both ankles. Toes are equivocal bilaterally. Fine motor skills and coordination: intact with normal finger taps, normal hand movements, normal rapid alternating patting, normal foot taps and normal foot agility.  Cerebellar testing: No dysmetria or intention tremor on finger to nose testing. Heel to shin is unremarkable bilaterally. There is no truncal or gait ataxia.  Sensory exam: intact to light touch, pinprick, vibration, temperature sense in the upper and lower extremities.  Gait, station and balance: He stands with no difficulty. No veering to one side is noted. No leaning to one side is noted. Posture is age-appropriate and stance is narrow based. Gait shows normal stride length and normal pace. No problems turning are noted. He does have a tendency  to look down and neck is tilted more forward when walking, otherwise no posture problems, arm swing is preserved. Tandem walk is difficult for him.              Assessment and Plan:   In summary, Timothy Stanton is a very pleasant 75 y.o.-year old male with an underlying medical history of hyperlipidemia, hearing loss, mitral valve prolapse, ED, degenerative joint disease, diverticulosis, tinnitus, and remote history of shingles of the right shoulder in the 1990s, arthritis, status post hip surgery including total hip replacement, who presents for initial consultation of an approximately 2 year history of involuntary muscle  twitching in keeping with fasciculations with questionable myokymia noted, as well as muscle cramps, mild weakness noted in his hand on the right. On examination, he does have evidence of fasciculations, variable findings on reflex testing, equivocal Babinski, mild weakness in his right arm and hand, no sensory findings, no lateralization otherwise, no parkinsonism per se, no evidence of mood disorder or memory disorder. Differential diagnosis includes cervical cord compression, widespread radiculopathy, myopathy, motor neuron disease, benign fasciculations, benign cramping etc. I explained to the patient that we need to dig a little deeper, I would like to proceed with a cervical spine MRI and EMG and nerve conduction testing. Also explained to him that we may have to do more specific additional testing such as muscle biopsy, more specialized neuromuscular input. We will call with test results in the interim, I will see him back routinely in a couple of months, sooner as needed. I answered all their questions today and the patient and his wife were in agreement. He was reminded to continue to maintain a healthy lifestyle, consider increase water intake.  Thank you very much for allowing me to participate in the care of this nice patient. If I can be of any further assistance to you please do  not hesitate to call me at [phone].  Sincerely,   Star Age, MD, PhD

## 2016-03-10 ENCOUNTER — Ambulatory Visit
Admission: RE | Admit: 2016-03-10 | Discharge: 2016-03-10 | Disposition: A | Discharge status: Home / Self Care | Payer: PPO | Source: Ambulatory Visit | Attending: Neurology | Admitting: Neurology

## 2016-03-10 DIAGNOSIS — G5601 Carpal tunnel syndrome, right upper limb: Secondary | ICD-10-CM

## 2016-03-10 DIAGNOSIS — R253 Fasciculation: Secondary | ICD-10-CM | POA: Diagnosis not present

## 2016-03-10 DIAGNOSIS — G253 Myoclonus: Secondary | ICD-10-CM

## 2016-03-10 DIAGNOSIS — G8929 Other chronic pain: Secondary | ICD-10-CM

## 2016-03-10 DIAGNOSIS — R29898 Other symptoms and signs involving the musculoskeletal system: Secondary | ICD-10-CM

## 2016-03-10 DIAGNOSIS — M542 Cervicalgia: Secondary | ICD-10-CM

## 2016-03-10 MED ORDER — GADOBENATE DIMEGLUMINE 529 MG/ML IV SOLN
17.0000 mL | Freq: Once | INTRAVENOUS | Status: AC | PRN
  Administered 2016-03-10: 17 mL via INTRAVENOUS

## 2016-03-12 ENCOUNTER — Telehealth: Payer: Self-pay

## 2016-03-12 NOTE — Progress Notes (Signed)
C spine MRI showed mild degenerative changes at C3-4 and C4-5 with mild nerve spinal canal and left-sided nerve root canal tightness, but nothing to explain his widespread muscle twitching, nothing to warrant surgical consultation. Please notify patient or spouse. Star Age, MD, PhD [facility])

## 2016-03-12 NOTE — Telephone Encounter (Signed)
I spoke to patient and he is aware of results and recommendations.  

## 2016-03-12 NOTE — Telephone Encounter (Signed)
-----   Message from Star Age, MD sent at 03/12/2016  7:54 AM EDT ----- C spine MRI showed mild degenerative changes at C3-4 and C4-5 with mild nerve spinal canal and left-sided nerve root canal tightness, but nothing to explain his widespread muscle twitching, nothing to warrant surgical consultation. Please notify patient or spouse. Star Age, MD, PhD [facility])

## 2016-03-27 ENCOUNTER — Ambulatory Visit (INDEPENDENT_AMBULATORY_CARE_PROVIDER_SITE_OTHER): Payer: PPO | Admitting: Neurology

## 2016-03-27 ENCOUNTER — Ambulatory Visit (INDEPENDENT_AMBULATORY_CARE_PROVIDER_SITE_OTHER): Payer: Self-pay | Admitting: Neurology

## 2016-03-27 DIAGNOSIS — R531 Weakness: Secondary | ICD-10-CM

## 2016-03-27 DIAGNOSIS — R29898 Other symptoms and signs involving the musculoskeletal system: Secondary | ICD-10-CM

## 2016-03-27 DIAGNOSIS — G253 Myoclonus: Secondary | ICD-10-CM

## 2016-03-27 DIAGNOSIS — M542 Cervicalgia: Secondary | ICD-10-CM

## 2016-03-27 DIAGNOSIS — R253 Fasciculation: Secondary | ICD-10-CM | POA: Diagnosis not present

## 2016-03-27 DIAGNOSIS — G8929 Other chronic pain: Secondary | ICD-10-CM

## 2016-03-27 DIAGNOSIS — M6250 Muscle wasting and atrophy, not elsewhere classified, unspecified site: Secondary | ICD-10-CM

## 2016-03-27 DIAGNOSIS — G5601 Carpal tunnel syndrome, right upper limb: Secondary | ICD-10-CM

## 2016-03-27 DIAGNOSIS — Z0289 Encounter for other administrative examinations: Secondary | ICD-10-CM

## 2016-03-27 NOTE — Procedures (Addendum)
   NCS (NERVE CONDUCTION STUDY) WITH EMG (ELECTROMYOGRAPHY) REPORT   STUDY DATE: March 27 2016 PATIENT NAME: Timothy Stanton DOB: 02-21-1941 MRN: [id number]    TECHNOLOGIST: Laretta Alstrom ELECTROMYOGRAPHER: Marcial Pacas M.D.  CLINICAL INFORMATION:  75 years old right-handed male, presenting with a year history of pain is muscle atrophy, fasciculations and weakness, he also developed neck.  On examination: There was no significant tongue muscle atrophy, but there was noticeable fasciculations, mild tongue protrusion into cheek muscle weakness. He has mild neck flexion weakness. There was frequent muscle fasciculations involving bilateral biceps, triceps, proximal thigh muscles. He has mild bilateral shoulder abduction, external rotation weakness. He has moderate right intrinsic hand muscle atrophy, mild right finger abduction and grip weakness. There was no significant bilateral lower extremity proximal and distal muscle weakness. He has hyperreflexia with bilateral Babinski signs. Sensory examination was intact to light touch pinprick and vibratory sensations.   FINDINGS: NERVE CONDUCTION STUDY: Left peroneal sensory response was normal. Bilateral ulnar sensory responses were normal. Bilateral median sensory responses showed mild to moderately prolonged peak latency, with well-preserved snap amplitude.  Left tibial, peroneal to EDB motor responses were normal.  Left ulnar motor responses were normal. Bilateral median motor responses showed moderately prolonged distal latency, and F-wave latency, with moderately decreased C map amplitude, conduction velocities.  Right ulnar motor responses showed moderately prolonged distal latency, with mildly prolonged F-wave latency, with mildly decreased C map amplitude, conduction velocities was normal.  There was no evidence of temporal dispersion.   NEEDLE ELECTROMYOGRAPHY: Extensive needle examination was performed at bilateral upper, lower  extremity muscles, right cervical, right lumbar sacral, right thoracic paraspinal muscles, right sternocleidomastoid, left genioglossus.  Bilateral biceps, triceps, deltoid: Increased insertional activity, 1 plus spontaneous activity, frequent muscle fasciculations, enlarged complex motor unit potentials, with decreased recruitment patterns.   Right first dorsal interossei, extensor digitorum communis: Increased insertional activity, frequent muscle fasciculations, 1-2 plus spontaneous activity, enlarged complex motor unit potential with decreased recruitment patterns.  There was no spontaneous activity at bilateral cervical paraspinal muscles, at bilateral C5-6 and 7, the paraspinal muscles has complex morphology.  Bilateral tibialis anterior, medial gastrocnemius, vastus lateralis: Increased insertional activity, 1-2 plus spontaneous activity, frequent muscle fasciculations enlarged motor unit potential with decreased recruitment patterns.  There was no spontaneous activity at right lumbosacral paraspinal muscles, right L4-5, S1  Right sternocleidomastoid, normal insertion activity, no spontaneous activity, no fasciculation notice, mild complicated morphology motor unit potential, with mildly decreased recruitment patterns.  Left genioglossus: Normal insertion activity, no spontaneous activity, no fasciculation detected, normal morphology motor unit potential, with mildly decreased recruitment patterns.  Right thoracic paraspinals T 9, T10:  Increased insertional activity, 2 plus spontaneous activity, complex motor unit potential.   IMPRESSION:   This is a marked abnormal study. There is widespread active neuropathic changes involving bilateral cervical, lumbar sacral, right thoracic myotomes. Milder chronic neuropathic changes was noticed at right bulbar myotome. 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    INTERPRETING PHYSICIAN:   Marcial Pacas M.D. Ph.D. [facility] [address] [place], [place] [address] [phone]

## 2016-03-27 NOTE — Progress Notes (Signed)
Please see EMG nerve conduction study under the procedure section

## 2016-03-30 ENCOUNTER — Telehealth: Payer: Self-pay

## 2016-03-30 NOTE — Telephone Encounter (Signed)
I spoke to patient and he is aware of message below. Patient was able to come in on Thursday to discuss results.

## 2016-03-30 NOTE — Telephone Encounter (Signed)
-----   Message from Star Age, MD sent at 03/30/2016 11:39 AM EDT ----- I reviewed his recent EMG and nerve conduction test results and also talked to Dr. Krista Blue about his test. I would like to ask him to come in for an appointment so we can discuss test results and further diagnostic tests and management strategies. pls call patient or wife to make appt.  Star Age, MD, PhD [facility])

## 2016-03-30 NOTE — Progress Notes (Signed)
I reviewed his recent EMG and nerve conduction test results and also talked to Dr. Krista Blue about his test. I would like to ask him to come in for an appointment so we can discuss test results and further diagnostic tests and management strategies. pls call patient or wife to make appt.  Star Age, MD, PhD [facility])

## 2016-04-02 ENCOUNTER — Ambulatory Visit (INDEPENDENT_AMBULATORY_CARE_PROVIDER_SITE_OTHER): Payer: PPO | Admitting: Neurology

## 2016-04-02 ENCOUNTER — Encounter: Payer: Self-pay | Admitting: Neurology

## 2016-04-02 VITALS — BP 138/78 | HR 90 | Resp 18 | Ht 70.0 in | Wt 180.0 lb

## 2016-04-02 DIAGNOSIS — R0683 Snoring: Secondary | ICD-10-CM | POA: Diagnosis not present

## 2016-04-02 DIAGNOSIS — R0681 Apnea, not elsewhere classified: Secondary | ICD-10-CM | POA: Diagnosis not present

## 2016-04-02 DIAGNOSIS — G253 Myoclonus: Secondary | ICD-10-CM

## 2016-04-02 DIAGNOSIS — R94131 Abnormal electromyogram [EMG]: Secondary | ICD-10-CM

## 2016-04-02 DIAGNOSIS — M6250 Muscle wasting and atrophy, not elsewhere classified, unspecified site: Secondary | ICD-10-CM

## 2016-04-02 DIAGNOSIS — R531 Weakness: Secondary | ICD-10-CM

## 2016-04-02 DIAGNOSIS — R253 Fasciculation: Secondary | ICD-10-CM

## 2016-04-02 NOTE — Progress Notes (Addendum)
Subjective:    Patient ID: Timothy Stanton is a 75 y.o. male.  HPI     Interim history:   Mr. Timothy Stanton is a 75 year old right-handed gentleman with an underlying medical history of hyperlipidemia, hearing loss, mitral valve prolapse, ED, degenerative joint disease, diverticulosis, tinnitus, and remote history of shingles of the right shoulder in the 1990s, arthritis, status post hip surgery including total hip replacement, who presents for follow-up consultation of his weakness and fasciculations as well as muscle twitching. The patient is accompanied by his wife again today. I first met him on 02/18/2016 at the request of his primary care physician, at which time the patient reported a few year history of muscle twitching and muscle cramps in various areas of his body, also weakness particularly of his right hand. He had had recent blood work with his primary care physician which we reviewed. I ordered further testing in the form of cervical spine MRI and EMG and nerve conduction testing in our office. He had a C-spine MRI with and without contrast on 03/10/2016 and I reviewed the results:    IMPRESSION:  Abnormal MRI scan of cervical spine showing mild degenerative changes at C3-4 and C4-5 with mild posterior canal and left-sided foraminal narrowing. We called him with his test results.   He had an EMG and nerve conduction test with Dr. Krista Blue on 03/27/2016 and I reviewed the results:     This is a marked abnormal study. There is widespread active neuropathic changes involving bilateral cervical, lumbar sacral, right thoracic myotomes. Milder chronic neuropathic changes was noticed at right bulbar myotome. 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   I requested a follow-up appointment to talk about further diagnostics and where to go from here.   Today,  04/02/2016: He reports doing about the same, just had a recent cold, so did his wife. Stays active. May not always drink enough water. Recently went to the mountains with his wife. No recent falls, describes no choking, no difficulty breathing, no difficulty speaking. Has occasional neck pain, is status post right total hip replacement some 10 years ago. Admits to not always eating a balanced diet, denies any significant exposure to chemicals or toxins and his work life. Likes to eat "junk food", he admits. Likes to drink sweet tea or green tea. Snores, has occasional apneic pauses per wife, snoring used to be worse.  Previously:   02/18/2016: He reports muscle cramps in various areas of his body for the past few years. Also smaller muscle twitching around different areas of his body including thighs, calves, forearms, biceps areas. He has noted weakness in his right hand, and problems with dexterity, noticed muscle wasting in his right hand primarily. He has no history of fall, no head injury, no TIA type symptoms, denies shortness of breath, chest pain, swallowing difficulty speech difficulty, memory loss. He reports difficulty with sleep which has been a chronic, nearly lifelong problem. He used to snore but does not snore as much any longer according to wife, he has intentionally been working on weight loss. He has reduced his alcohol intake. He has never been a heavy drinker. He has no history of diabetes. Family history is significant for polio and his older brother who is now 99 years old and has also diabetes. His younger brother is 9 years old and has a history of childhood epilepsy and was apparently recently diagnosed with Parkinson's  disease. His father lived to be into his 9s and died of complications of his kidney disease. His mother lived to be in her late 11s and died of complications of choking. She also was diagnosed with dementia. He has 3 children from his prior marriage, son 55, son 44,  daughter 72. He is active and spends a lot of time in the mountains, is physically quite active and out in the woods, mowing the lawn etc. He tries to drink enough water but may not always do a good enough job with that he admits. He likes iced tea about 2 glasses per day, does not drink coffee, and does not drink alcohol daily. His wife is noted an abnormal neck tilt or tendency to look down when he walks, he has noted some neck stiffness and some pain in his neck for the past year. He has no tremors. Symptoms started gradually and he noticed cramping when he had coughing, this would result in severe cramping under his rib cage. Sometimes he has nocturnal cramps in his legs. He does not have a tendency to stagger and has not fallen recently. Symptoms have been progressive but lately he feels he may have plateaued. He does have a history of diplopia for which he was given prisms, this improved. He wears prism eye glasses on the right. I reviewed your office note from 01/28/2016 which you kindly included. Recent laboratory tests in your office included testosterone level, CMP, CBC with differential, PSA, aldolase, CK, sedimentation rate, CRP, SPEP, TSH, lipid panel, and EKG. I reviewed lab test results from your office which included normal CBC with differential with the exception of hemoglobin of 17.5 and hematocrit borderline at 52.9 elevated. Sedimentation rate was 1, CRP 1.8, CK 113, other results pending, we will request the remaining test results from your office.  His Past Medical History Is Significant For: Past Medical History:  Diagnosis Date  . ED (erectile dysfunction)   . Gout   . Hypercholesteremia   . Medical history non-contributory   . Peyronie disease   . Tinnitus     His Past Surgical History Is Significant For: Past Surgical History:  Procedure Laterality Date  . JOINT REPLACEMENT Right 4 yrs ago  . right hip pinning   6 1/2 yrs ago    His Family History Is Significant  For: Family History  Problem Relation Age of Onset  . Mental illness Mother   . Renal Disease Father     His Social History Is Significant For: Social History   Social History  . Marital status: Married    Spouse name: N/A  . Number of children: 3  . Years of education: HS   Occupational History  . Retired    Social History Main Topics  . Smoking status: Former Research scientist (life sciences)  . Smokeless tobacco: Never Used     Comment: Quit [id number]  . Alcohol use Yes     Comment: wine 1 -2 glasses day  . Drug use: No  . Sexual activity: Not Asked   Other Topics Concern  . None   Social History Narrative   Drinks 7-10 cups of tea a week     His Allergies Are:  Allergies  Allergen Reactions  . Penicillins Other (See Comments)    Fever yrs ago  :   His Current Medications Are:  Outpatient Encounter Prescriptions as of 04/02/2016  Medication Sig  . aspirin EC 81 MG tablet Take 81 mg by mouth daily.  . Cholecalciferol (VITAMIN  D3) 2000 units TABS Take by mouth.  . Glucosamine-Chondroitin 1500-1200 MG/30ML LIQD Take by mouth.  . Multiple Vitamin (MULTIVITAMIN WITH MINERALS) TABS Take 1 tablet by mouth daily.  . sildenafil (VIAGRA) 100 MG tablet Take 100 mg by mouth daily as needed for erectile dysfunction.  . [DISCONTINUED] Coenzyme Q10 100 MG capsule Take 100 mg by mouth daily.   No facility-administered encounter medications on file as of 04/02/2016.   :  Review of Systems:  Out of a complete 14 point review of systems, all are reviewed and negative with the exception of these symptoms as listed below:  Review of Systems  Neurological:       Patient is here to discuss EMG.     Objective:  Neurologic Exam  Physical Exam Physical Examination:   Vitals:   04/02/16 1420  BP: 138/78  Pulse: 90  Resp: 18    General Examination: The patient is a very pleasant 75 y.o. male in no acute distress. He appears well-developed and well-nourished and well groomed.   HEENT:  Normocephalic, atraumatic, pupils are equal, round and reactive to light and accommodation.  Extraocular tracking is good without limitation to gaze excursion or nystagmus noted. Normal smooth pursuit is noted. Hearing is grossly intact. Face is symmetric with normal facial animation and normal facial sensation. Speech is clear with no dysarthria noted. There is no hypophonia. There is no lip, neck/head, jaw or voice tremor. Neck is Fairly supple, maybe mild rigidity noted, some decrease in active neck turned to the left, mild forward tilt in the neck, all unchanged. Oropharynx exam reveals: mild mouth dryness, adequate dental hygiene. Tongue protrudes centrally and palate elevates symmetrically.    Chest: Clear to auscultation without wheezing, rhonchi or crackles noted.  Heart: S1+S2+0, regular and normal without murmurs, rubs or gallops noted.   Abdomen: Soft, non-tender and non-distended with normal bowel sounds appreciated on auscultation.  Extremities: There is no pitting edema in the distal lower extremities bilaterally. Pedal pulses are intact. He does have thenar wasting and mild intrinsic hand muscle wasting bilaterally, right more prominent than left. Also has a thinner thighs bilaterally, right side worse than left.  Skin: Warm and dry without trophic changes noted. There are no varicose veins. Feet and toes are slightly purplish in color, slightly cooler to touch, but he has good pulses.  Musculoskeletal: exam reveals no obvious joint deformities, tenderness or joint swelling or erythema.   Neurologically:  Mental status: The patient is awake, alert and oriented in all 4 spheres. His immediate and remote memory, attention, language skills and fund of knowledge are appropriate. There is no evidence of aphasia, agnosia, apraxia or anomia. Speech is clear with normal prosody and enunciation. Thought process is linear. Mood is normal and affect is normal.  Cranial nerves II - XII are as  described above under HEENT exam. In addition: shoulder shrug is normal with equal shoulder height noted. Motor exam: Overall fairly lean or thin bulk noted even, thigh muscles appear thinner but he does not believe he has lost any muscle bulk in his thighs, he does have muscle wasting in his thenar muscles and intrinsic hand muscles on the right more than left, overall thin muscle bulk noted, tone seems normal, has widespread fasciculations in both thighs, hamstrings, calves, forearms, and upper arms. Muscle weakness is mild, right more pronounced than left, has some grip strength weakness in intrinsic hand muscle weakness on the right more than left. Romberg is negative. Reflexes are brisk in  the biceps areas, brisk in the left patellar, 1+ in the ankles, spread noted with left patellar reflex.  Reflexes are 3+ in the biceps, 2+ in the brachial radialis bilaterally, 2-3+ in both knees, trace or absent in both ankles. Fine motor skills and coordination: Very mildly impaired in the right hand. Cerebellar testing: No dysmetria or intention tremor.  Sensory exam: intact in the upper and lower extremities.  Gait, station and balance: He stands with no difficulty. No veering to one side is noted. No leaning to one side is noted. Posture is age-appropriate and stance is narrow based. Gait shows normal stride length and normal pace. No problems turning are noted. He does have a tendency to look down and neck is tilted more forward when walking, otherwise no posture problems, arm swing is preserved.  Assessment and Plan:   In summary, Brentin Shin Brunkhorst is a very pleasant 75 year old male with an underlying medical history of hyperlipidemia, hearing loss, mitral valve prolapse, ED, degenerative joint disease, diverticulosis, tinnitus, and remote history of shingles of the right shoulder in the 1990s, arthritis, status post hip surgery including total hip replacement, who presents for Follow-up visit of an approximately  2 year history of involuntary muscle twitching, fasciculations, mild weakness, after his recent initial testing. We had a long discussion today. I tried to answer all their questions to the best of my knowledge. We did a cervical spine MRI which showed mild degenerative changes, not unusual for age, we did an EMG and nerve conduction test and we talked about test results extensively today. Findings are significantly abnormal and high on the differential is motor neuron disease, I did explain to the patient that we will have to do additional testing and at this juncture I would like to involve an expert in neuromuscular diseases, in particular, Dr. Jeneen Rinks Caress at [facility]. I made a referral in that regard. I would request ongoing care with Dr. Vallarie Mare. His exam is for the most part stable. In the interim, I would like to proceed with additional testing including MRI of the thoracic and lumbar spine, MRI of the brain, blood work to include vitamin B12, copper, RPR, HIV, acetylcholine receptor binding antibodies. We will call with test results in the interim. We will do sleep study testing too.  I did ask the patient to stay active as best as he can, no recent cut back or increase his activity level. He is advised to adhere to a more healthy and nutritious diet and increase his water intake. I spent 25 minutes in total face-to-face time with the patient, more than 50% of which was spent in counseling and coordination of care, reviewing test results, reviewing medication and discussing or reviewing the possible differential diagnoses.

## 2016-04-02 NOTE — Addendum Note (Signed)
Addended by: Star Age on: 04/02/2016 03:30 PM   Modules accepted: Orders

## 2016-04-02 NOTE — Patient Instructions (Addendum)
We will do more blood work and MRI of the brain and spine.  I will refer to [facility], neurology, Dr Tedra Coupe in neuromuscular, ph: [phone].

## 2016-04-06 ENCOUNTER — Telehealth: Payer: Self-pay

## 2016-04-06 NOTE — Telephone Encounter (Signed)
I spoke to patient and he is aware of results and recommendations.  

## 2016-04-06 NOTE — Progress Notes (Signed)
Most labs back and normal. A1c, diabetes marker, borderline, but not indicative of frank diabetes. Vit D on the low end of spectrum, may not be a bad idea to take OTC vit D, 1000-2000 units daily.  Will update as other tests come back, please notify patient or wife. Star Age, MD, PhD [facility])

## 2016-04-06 NOTE — Telephone Encounter (Signed)
-----   Message from Star Age, MD sent at 04/06/2016  7:52 AM EDT ----- Most labs back and normal. A1c, diabetes marker, borderline, but not indicative of frank diabetes. Vit D on the low end of spectrum, may not be a bad idea to take OTC vit D, 1000-2000 units daily.  Will update as other tests come back, please notify patient or wife. Star Age, MD, PhD [facility])

## 2016-04-07 LAB — HEAVY METALS PROFILE II, BLOOD
ARSENIC: 8 ug/L (ref 2–23)
Cadmium: NOT DETECTED ug/L (ref 0.0–1.2)
Lead, Blood: 1 ug/dL (ref 0–19)
Mercury: 2.2 ug/L (ref 0.0–14.9)

## 2016-04-07 LAB — ACETYLCHOLINE RECEPTOR, BINDING: AChR Binding Ab, Serum: <0.03 nmol/L (ref 0.00–0.24)

## 2016-04-07 LAB — VITAMIN D 25 HYDROXY (VIT D DEFICIENCY, FRACTURES): VIT D 25 HYDROXY: 33.3 ng/mL (ref 30.0–100.0)

## 2016-04-07 LAB — B12 AND FOLATE PANEL
Folate: >20 ng/mL (ref >3.0)
VITAMIN B 12: 448 pg/mL (ref 211–946)

## 2016-04-07 LAB — COPPER, SERUM: COPPER: 96 ug/dL (ref 72–166)

## 2016-04-07 LAB — RPR: RPR Ser Ql: NONREACTIVE

## 2016-04-07 LAB — ANA W/REFLEX: ANA: NEGATIVE

## 2016-04-07 LAB — HGB A1C W/O EAG: Hgb A1c MFr Bld: 6.1 % — ABNORMAL HIGH (ref 4.8–5.6)

## 2016-04-07 LAB — VITAMIN B1: THIAMINE: 195.8 nmol/L (ref 66.5–200.0)

## 2016-04-07 LAB — HIV ANTIBODY (ROUTINE TESTING W REFLEX): HIV Screen 4th Generation wRfx: NONREACTIVE

## 2016-04-07 LAB — HTLV I+II ANTIBODIES, (EIA), BLD: HTLV I/II AB: NEGATIVE

## 2016-04-22 ENCOUNTER — Other Ambulatory Visit: Payer: PPO

## 2016-04-23 ENCOUNTER — Telehealth: Payer: Self-pay | Admitting: Neurology

## 2016-04-23 NOTE — Telephone Encounter (Signed)
Wife Tye [place] called to advise, Dr. Rexene Alberts referred husband to [facility]/Dr. Tedra Coupe, Dr. Vallarie Mare has diagnosed patient w/ ALS and wants to treat him at the clinic there, wife states clinic is out of network w/insurance and needs PA saying he needs treatment there.

## 2016-04-23 NOTE — Telephone Encounter (Signed)
I called the wife back and let her know that we will work on this.  Hinton Dyer, can you help with this? Or does Dr. Vallarie Mare' office need to complete this?

## 2016-04-27 NOTE — Telephone Encounter (Signed)
Ok for letter

## 2016-04-27 NOTE — Telephone Encounter (Signed)
Called and spoke to Patient's wife Tye [place] .  [facility] called  [employer].  And insurance relayed that they need a letter from Dr. Roderick Pee that  Patient needs to attend ALS clinic with Dr. Tedra Coupe because he is out of network and state why he needs to attend ALS clinic . Health team may over ride this for patient to go.

## 2016-04-27 NOTE — Telephone Encounter (Signed)
Please furnish letter on patient's behalf to attest to insurance that the patient has been diagnosed with motor neuron disease, in particular ALS, and would highly benefit from care at the comprehensive ALS clinic at [facility] under specialist Dr. Tedra Coupe. This will ensure proper diagnostic testing, optimize treatment and ultimately help with overall prognosis and outcome.

## 2016-04-28 NOTE — Telephone Encounter (Signed)
Letter completed, approved by Dr. Rexene Alberts and printed.

## 2016-04-29 NOTE — Telephone Encounter (Signed)
Called and spoke to Patient's wife  And she is  Going to call insurance company back and get fax number and who I need to Attention letter to. Letter is completed.

## 2016-04-29 NOTE — Telephone Encounter (Signed)
Letter given to [facility]

## 2016-05-20 ENCOUNTER — Encounter: Payer: Self-pay | Admitting: Hematology

## 2016-05-20 ENCOUNTER — Telehealth: Payer: Self-pay | Admitting: Hematology

## 2016-05-20 NOTE — Telephone Encounter (Signed)
Pt 's wife returned call and confirmed appt, verified demo and insurance, mailed pt letter, faxed referring provider appt date/time

## 2016-06-01 DIAGNOSIS — H5000 Unspecified esotropia: Secondary | ICD-10-CM | POA: Diagnosis not present

## 2016-06-09 NOTE — Telephone Encounter (Signed)
Called Patient's wife back and spoke to her.  Patient and his wife have switched to [facility]. Insurance will begin in January. Patient will Continue to see Dr. Vallarie Mare and pay out of pocket until new insurance starts.

## 2016-06-22 ENCOUNTER — Encounter: Payer: Self-pay | Admitting: Neurology

## 2016-06-29 ENCOUNTER — Ambulatory Visit (HOSPITAL_BASED_OUTPATIENT_CLINIC_OR_DEPARTMENT_OTHER): Payer: PPO | Admitting: Hematology

## 2016-06-29 ENCOUNTER — Ambulatory Visit (HOSPITAL_BASED_OUTPATIENT_CLINIC_OR_DEPARTMENT_OTHER): Payer: PPO

## 2016-06-29 ENCOUNTER — Telehealth: Payer: Self-pay | Admitting: Hematology

## 2016-06-29 VITALS — BP 145/68 | HR 74 | Temp 98.2°F | Resp 17 | Ht 70.0 in | Wt 171.6 lb

## 2016-06-29 DIAGNOSIS — G1220 Motor neuron disease, unspecified: Secondary | ICD-10-CM

## 2016-06-29 DIAGNOSIS — D472 Monoclonal gammopathy: Secondary | ICD-10-CM | POA: Diagnosis not present

## 2016-06-29 LAB — COMPREHENSIVE METABOLIC PANEL
ALK PHOS: 66 U/L (ref 40–150)
ALT: 31 U/L (ref 0–55)
AST: 30 U/L (ref 5–34)
Albumin: 4 g/dL (ref 3.5–5.0)
Anion Gap: 10 mEq/L (ref 3–11)
BILIRUBIN TOTAL: 1.05 mg/dL (ref 0.20–1.20)
BUN: 16.3 mg/dL (ref 7.0–26.0)
CALCIUM: 9.7 mg/dL (ref 8.4–10.4)
CO2: 28 mEq/L (ref 22–29)
Chloride: 101 mEq/L (ref 98–109)
Creatinine: 0.9 mg/dL (ref 0.7–1.3)
EGFR: 79 mL/min/{1.73_m2} — AB (ref >90)
GLUCOSE: 97 mg/dL (ref 70–140)
Potassium: 4.3 mEq/L (ref 3.5–5.1)
SODIUM: 140 meq/L (ref 136–145)
TOTAL PROTEIN: 7.7 g/dL (ref 6.4–8.3)

## 2016-06-29 LAB — CBC & DIFF AND RETIC
BASO%: 0.3 % (ref 0.0–2.0)
BASOS ABS: 0 10*3/uL (ref 0.0–0.1)
EOS ABS: 0.2 10*3/uL (ref 0.0–0.5)
EOS%: 2.7 % (ref 0.0–7.0)
HEMATOCRIT: 42.9 % (ref 38.4–49.9)
HEMOGLOBIN: 14.3 g/dL (ref 13.0–17.1)
Immature Retic Fract: 5.3 % (ref 3.00–10.60)
LYMPH#: 0.7 10*3/uL — AB (ref 0.9–3.3)
LYMPH%: 9.7 % — ABNORMAL LOW (ref 14.0–49.0)
MCH: 31.1 pg (ref 27.2–33.4)
MCHC: 33.3 g/dL (ref 32.0–36.0)
MCV: 93.3 fL (ref 79.3–98.0)
MONO#: 0.6 10*3/uL (ref 0.1–0.9)
MONO%: 7.7 % (ref 0.0–14.0)
NEUT#: 5.9 10*3/uL (ref 1.5–6.5)
NEUT%: 79.6 % — ABNORMAL HIGH (ref 39.0–75.0)
Platelets: 236 10*3/uL (ref 140–400)
RBC: 4.6 10*6/uL (ref 4.20–5.82)
RDW: 12.8 % (ref 11.0–14.6)
RETIC CT ABS: 58.88 10*3/uL (ref 34.80–93.90)
Retic %: 1.28 % (ref 0.80–1.80)
WBC: 7.4 10*3/uL (ref 4.0–10.3)

## 2016-06-29 LAB — LACTATE DEHYDROGENASE: LDH: 220 U/L (ref 125–245)

## 2016-06-29 NOTE — Telephone Encounter (Signed)
Pt confirmed appt and received avs °

## 2016-06-29 NOTE — Progress Notes (Signed)
Marland Kitchen    HEMATOLOGY/ONCOLOGY CONSULTATION NOTE  Date of Service: 06/29/2016  Patient Care Team: Josetta Huddle, MD as PCP - General (Internal Medicine)  CHIEF COMPLAINTS/PURPOSE OF CONSULTATION:  M-spike on SPEP  HISTORY OF PRESENTING ILLNESS:    Timothy Stanton is a wonderful 75 y.o. male who has been referred to [place] by Dr .Henrine Screws, MD  for evaluation and management of M-spike on SPEP.  Patient is an overall fairly healthy 75 year old gentleman with history of dyslipidemia, gout, erectile dysfunction presented with muscle twitching and cramping and hand weakness for the last couple of years and was referred to Dr. Rexene Alberts 02/18/2016 at [facility] neurology for further evaluation. He had no conduction studies/EMG on 03/27/2016 that showed diffuse motor neuron disease he was referred to Dr. Tedra Coupe at [facility] and was diagnosed with ALS. Other workup for nutritional deficiencies, infectious etiology , and inflammatory disorders was unrevealing. Patient has had no sensory deficits . Notes that he has been started on noninvasive mechanical ventilation at nighttime to help with his breathing . As a part of his workup he had an SPEP that his primary care physician on 05/07/2016 which showed an M spike of 0.4 . No IFE or SFLC done at this time.  His outside labs have not been concerning for anemia hypercalcemia or renal failure . He has had no new bone pains . No peripheral sensory symptoms . He notes that his symptoms of muscle weakness and fasciculations have been gradually progressing for the last few years from 2015 and noticed atrophy and right hand weakness in approximately June of 2017  Notes some element of depression and has been eating somewhat less. Is on amitriptyline as per Dr. Vallarie Mare.  MEDICAL HISTORY:  Past Medical History:  Diagnosis Date  . ED (erectile dysfunction)   . Gout   . Hypercholesteremia   . Medical history non-contributory   . Peyronie disease   . 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 JOINT REPLACEMENT Right 4 yrs ago  . right hip pinning   6 1/2 yrs ago    SOCIAL HISTORY: Social History   Social History  . Marital status: Married    Spouse name: N/A  . Number of children: 3  . Years of education: HS   Occupational History  . Retired    Social History Main Topics  . Smoking status: Former Research scientist (life sciences)  . Smokeless tobacco: Never Used     Comment: Quit [id number]  . Alcohol use Yes     Comment: wine 1 -2 glasses day  . Drug use: No  . Sexual activity: Not on file   Other Topics Concern  . Not on file   Social History Narrative   Drinks 7-10 cups of tea a week   Worked in the [employer] traveled to [place] and [place] for business .  FAMILY HISTORY: Family History  Problem Relation Age of Onset  . Mental illness Mother   . Renal Disease Father     ALLERGIES:  is allergic to penicillins.  MEDICATIONS:  Current Outpatient Prescriptions  Medication Sig Dispense Refill  . Alpha-Lipoic Acid 600 MG CAPS Take by mouth daily.    . riluzole (RILUTEK) 50 MG tablet Take 50 mg by mouth.    Marland Kitchen amitriptyline (  ELAVIL) 25 MG tablet     . aspirin EC 81 MG tablet Take 81 mg by mouth daily.    . Cholecalciferol (VITAMIN D3) 2000 units TABS Take by mouth.    . Glucosamine-Chondroitin 1500-1200 MG/30ML LIQD Take by mouth.    . Multiple Vitamin (MULTIVITAMIN WITH MINERALS) TABS Take 1 tablet by mouth daily.     No current facility-administered medications for this visit.     REVIEW OF SYSTEMS:    10 Point review of Systems was done is negative except as noted above.  PHYSICAL EXAMINATION: ECOG PERFORMANCE STATUS: 2 - Symptomatic, <50% confined to bed  . Vitals:    06/29/16 1105  BP: (!) 145/68  Pulse: 74  Resp: 17  Temp: 98.2 F (36.8 C)   Filed Weights   06/29/16 1105  Weight: 171 lb 9.6 oz (77.8 kg)   .Body mass index is 24.62 kg/m.  GENERAL:alert, in no acute distress and comfortable SKIN: skin color, texture, turgor are normal, no rashes or significant lesions EYES: normal, conjunctiva are pink and non-injected, sclera clear OROPHARYNX:no exudate, no erythema and lips, buccal mucosa, and tongue normal  NECK: supple, no JVD, thyroid normal size, non-tender, without nodularity LYMPH:  no palpable lymphadenopathy in the cervical, axillary or inguinal LUNGS: clear to auscultation with normal respiratory effort HEART: regular rate & rhythm,  no murmurs and no lower extremity edema ABDOMEN: abdomen soft, non-tender, normoactive bowel sounds , No palpable hepatosplenomegaly  Musculoskeletal: no cyanosis of digits and no clubbing  PSYCH: alert & oriented x 3 with fluent speech NEURO: no focal motor/sensory deficits  LABORATORY DATA:  I have reviewed the data as listed  . CBC Latest Ref Rng & Units 06/29/2016 08/10/2008 08/09/2008  WBC 4.0 - 10.3 10e3/uL 7.4 9.6 8.1  Hemoglobin 13.0 - 17.1 g/dL 14.3 9.8 DELTA CHECK NOTED(L) 7.9 CRITICAL RESULT CALLED TO, READ BACK BY AND VERIFIED WITH: HOYLE,T RN 0700 08/09/08 SMITHJ(LL)  Hematocrit 38.4 - 49.9 % 42.9 28.9(L) 22.3(L)  Platelets 140 - 400 10e3/uL 236 157 158 DELTA CHECK NOTED    . CMP Latest Ref Rng & Units 06/29/2016 08/10/2008 08/09/2008  Glucose 70 - 140 mg/dl 97 128(H) 124(H)  BUN 7.0 - 26.0 mg/dL 16.'3 10 13  '$ Creatinine 0.7 - 1.3 mg/dL 0.9 1.09 1.12  Sodium 136 - 145 mEq/L 140 132(L) 138  Potassium 3.5 - 5.1 mEq/L 4.3 4.3 4.4  Chloride 96 - 112 mEq/L - 96 101  CO2 22 - 29 mEq/L '28 29 30  '$ Calcium 8.4 - 10.4 mg/dL 9.7 8.2(L) 8.3(L)  Total Protein 6.4 - 8.3 g/dL 7.7 - -  Total Bilirubin 0.20 - 1.20 mg/dL 1.05 - -  Alkaline Phos 40 - 150 U/L 66 - -  AST 5 - 34 U/L 30 - -  ALT 0 - 55  U/L 31 - -   . Lab Results  Component Value Date   LDH 220 06/29/2016     RADIOGRAPHIC STUDIES: I have personally reviewed the radiological images as listed and agreed with the findings in the report. No results found.  ASSESSMENT & PLAN:   75 year old with   1) Monoclonal gammopathy of undetermined significance . Patient has no anemia, hypercalcemia, renal insufficiency or focal bone pains to suggest multiple myeloma at this time . Also the amount of M spike is quite minimal at 0.4 g/dL on outside labs .  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 ALS mimics workup has been negative to date.  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 -Statistically the chances of his MGUS being associated with diffuse pure motor neuropathy would be less likely. -His neurologists believe this is consistent with ALS and he has started on treatment for this. -I am not aware if he has had testing to rule out any acquired immune mediated neuropathies such as Multifocal motor neuropathy with GM1 antibodies or antibodies against other axon or myelin components such as the glycolipids GD1a or GM2. -Would defer the need for this testing to his neurology specialists. If this diagnosis or similar diagnosis is entertained. -If an immune motor neuropathy is considered typically neurology would consider treatment trial with IVIG and if clinical benefit is noted and if IFE shows especially an IgM MGUS - we sometimes consider treating empirically with Rituxan for suspected paraproteinemic neuropathy. -Statistically the likelihood of his MGUS causing his neurological picture would be relatively low . -In the absence of neurological concern for paraproteinemic immune neuropathy -there would be no  role for treatment of his MGUS at this time . -I shall follow up his currently ordered workup and call patient with the results. -Patient will need continued close follow-up with his neurology team and primary care physician. -All of the patient's questions were answered in details to the best of my ability.   In the absence of any other concerns we shall see him back in 6 months with repeat labs again for monitoring of his MGUS. The likelihood of MGUS progressing to multiple myeloma would be 1-2% in year which is quite low.  . Orders Placed This Encounter  Procedures  . DG Bone Survey Met    Standing Status:   Future    Standing Expiration Date:   08/29/2017    Order Specific Question:   Reason for Exam (SYMPTOM  OR DIAGNOSIS REQUIRED)    Answer:   staging myeloma    Order Specific Question:   Preferred imaging location?    Answer:   [facility]  . CBC & Diff and Retic    Standing Status:   Future    Number of Occurrences:   1    Standing Expiration Date:   06/29/2017  . Comprehensive metabolic panel    Standing Status:   Future    Number of Occurrences:   1    Standing Expiration Date:   06/29/2017  . Lactate dehydrogenase    Standing Status:   Future    Number of Occurrences:   1    Standing Expiration Date:   06/29/2017  . Multiple Myeloma Panel (SPEP&IFE w/QIG)    Standing Status:   Future    Number of Occurrences:   1    Standing Expiration Date:   06/29/2017  . Kappa/lambda light chains    Standing Status:   Future    Number of Occurrences:   1    Standing Expiration Date:   08/03/2017  . Beta 2 microglobulin, serum    Standing Status:   Future    Number of Occurrences:   1    Standing Expiration Date:   06/29/2017  . Sedimentation rate    Standing Status:   Future    Number of Occurrences:   1    Standing Expiration Date:   06/29/2017  . 24 Hr Ur UPEP/TP  Standing Status:   Future    Number of Occurrences:   1    Standing Expiration Date:    06/29/2017       All of the patients questions were answered with apparent satisfaction. The patient knows to call the clinic with any problems, questions or concerns.  I spent 45 minutes counseling the patient face to face. The total time spent in the appointment was 60 minutes and more than 50% was on counseling and direct patient cares.    Sullivan Lone MD [place] [facility]IV[facility] Hematology/Oncology Physician [facility]  (Office):       [phone] (Work cell):  [phone] (Fax):           [phone]

## 2016-06-30 LAB — BETA 2 MICROGLOBULIN, SERUM: BETA 2: 2.6 mg/L — AB (ref 0.6–2.4)

## 2016-06-30 LAB — KAPPA/LAMBDA LIGHT CHAINS
IG LAMBDA FREE LIGHT CHAIN: 11.7 mg/L (ref 5.7–26.3)
Ig Kappa Free Light Chain: 14.7 mg/L (ref 3.3–19.4)
KAPPA/LAMBDA FLC RATIO: 1.26 (ref 0.26–1.65)

## 2016-06-30 LAB — SEDIMENTATION RATE: Sedimentation Rate-Westergren: 12 mm/hr (ref 0–30)

## 2016-07-01 ENCOUNTER — Ambulatory Visit (HOSPITAL_COMMUNITY)
Admission: RE | Admit: 2016-07-01 | Discharge: 2016-07-01 | Disposition: A | Discharge status: Home / Self Care | Payer: PPO | Source: Ambulatory Visit | Attending: Hematology | Admitting: Hematology

## 2016-07-01 DIAGNOSIS — D472 Monoclonal gammopathy: Secondary | ICD-10-CM | POA: Diagnosis not present

## 2016-07-01 LAB — MULTIPLE MYELOMA PANEL, SERUM
ALBUMIN SERPL ELPH-MCNC: 3.9 g/dL (ref 2.9–4.4)
Albumin/Glob SerPl: 1.3 (ref 0.7–1.7)
Alpha 1: 0.2 g/dL (ref 0.0–0.4)
Alpha2 Glob SerPl Elph-Mcnc: 0.7 g/dL (ref 0.4–1.0)
B-GLOBULIN SERPL ELPH-MCNC: 1.1 g/dL (ref 0.7–1.3)
GAMMA GLOB SERPL ELPH-MCNC: 1.2 g/dL (ref 0.4–1.8)
GLOBULIN, TOTAL: 3.2 g/dL (ref 2.2–3.9)
IgA, Qn, Serum: 152 mg/dL (ref 61–437)
IgG, Qn, Serum: 1173 mg/dL (ref 700–1600)
IgM, Qn, Serum: 39 mg/dL (ref 15–143)
M PROTEIN SERPL ELPH-MCNC: 0.4 g/dL — AB
Total Protein: 7.1 g/dL (ref 6.0–8.5)

## 2016-07-03 LAB — UPEP/TP, 24-HR URINE
ALPHA-2-GLOBULIN, U: 7.9 %
Albumin, U: 46.2 %
Alpha-1-Globulin, U: 3.3 %
Beta Globulin, U: 22.6 %
GAMMA GLOBULIN, U: 20.1 %
PROTEIN UR: 12 mg/dL
Prot,24hr calculated: 192 mg/24 hr — ABNORMAL HIGH (ref 30–150)

## 2016-07-17 DIAGNOSIS — Z79899 Other long term (current) drug therapy: Secondary | ICD-10-CM | POA: Diagnosis not present

## 2016-07-17 DIAGNOSIS — G1221 Amyotrophic lateral sclerosis: Secondary | ICD-10-CM | POA: Diagnosis not present

## 2016-08-04 DIAGNOSIS — G1221 Amyotrophic lateral sclerosis: Secondary | ICD-10-CM | POA: Diagnosis not present

## 2016-08-12 DIAGNOSIS — H2512 Age-related nuclear cataract, left eye: Secondary | ICD-10-CM | POA: Diagnosis not present

## 2016-08-12 DIAGNOSIS — H524 Presbyopia: Secondary | ICD-10-CM | POA: Diagnosis not present

## 2016-08-12 DIAGNOSIS — Z961 Presence of intraocular lens: Secondary | ICD-10-CM | POA: Diagnosis not present

## 2016-08-12 DIAGNOSIS — H25013 Cortical age-related cataract, bilateral: Secondary | ICD-10-CM | POA: Diagnosis not present

## 2016-08-18 DIAGNOSIS — Z79899 Other long term (current) drug therapy: Secondary | ICD-10-CM | POA: Diagnosis not present

## 2016-08-18 DIAGNOSIS — G1221 Amyotrophic lateral sclerosis: Secondary | ICD-10-CM | POA: Diagnosis not present

## 2016-09-02 DIAGNOSIS — Z87891 Personal history of nicotine dependence: Secondary | ICD-10-CM | POA: Diagnosis not present

## 2016-09-02 DIAGNOSIS — R29898 Other symptoms and signs involving the musculoskeletal system: Secondary | ICD-10-CM | POA: Diagnosis not present

## 2016-09-02 DIAGNOSIS — R269 Unspecified abnormalities of gait and mobility: Secondary | ICD-10-CM | POA: Diagnosis not present

## 2016-09-02 DIAGNOSIS — R252 Cramp and spasm: Secondary | ICD-10-CM | POA: Diagnosis not present

## 2016-09-02 DIAGNOSIS — G1221 Amyotrophic lateral sclerosis: Secondary | ICD-10-CM | POA: Diagnosis not present

## 2016-09-02 DIAGNOSIS — R253 Fasciculation: Secondary | ICD-10-CM | POA: Diagnosis not present

## 2016-09-02 DIAGNOSIS — R531 Weakness: Secondary | ICD-10-CM | POA: Diagnosis not present

## 2016-09-02 DIAGNOSIS — Z79899 Other long term (current) drug therapy: Secondary | ICD-10-CM | POA: Diagnosis not present

## 2016-09-02 DIAGNOSIS — M62541 Muscle wasting and atrophy, not elsewhere classified, right hand: Secondary | ICD-10-CM | POA: Diagnosis not present

## 2016-09-02 DIAGNOSIS — R292 Abnormal reflex: Secondary | ICD-10-CM | POA: Diagnosis not present

## 2016-10-19 ENCOUNTER — Telehealth: Payer: Self-pay

## 2016-10-19 NOTE — Telephone Encounter (Signed)
LVM for pt on 04/15/16 to schedule sleep study.  Patient called back and doesn't want to schedule.

## 2016-11-11 DIAGNOSIS — G1221 Amyotrophic lateral sclerosis: Secondary | ICD-10-CM | POA: Diagnosis not present

## 2016-11-11 DIAGNOSIS — M62541 Muscle wasting and atrophy, not elsewhere classified, right hand: Secondary | ICD-10-CM | POA: Diagnosis not present

## 2016-11-11 DIAGNOSIS — R531 Weakness: Secondary | ICD-10-CM | POA: Diagnosis not present

## 2016-12-28 ENCOUNTER — Encounter: Payer: Self-pay | Admitting: Hematology

## 2016-12-28 ENCOUNTER — Telehealth: Payer: Self-pay | Admitting: Hematology

## 2016-12-28 ENCOUNTER — Ambulatory Visit (HOSPITAL_BASED_OUTPATIENT_CLINIC_OR_DEPARTMENT_OTHER): Payer: Medicare Other | Admitting: Hematology

## 2016-12-28 VITALS — BP 150/77 | HR 90 | Temp 98.3°F | Resp 18 | Ht 70.0 in | Wt 176.3 lb

## 2016-12-28 DIAGNOSIS — D472 Monoclonal gammopathy: Secondary | ICD-10-CM | POA: Diagnosis not present

## 2016-12-28 DIAGNOSIS — M109 Gout, unspecified: Secondary | ICD-10-CM

## 2016-12-28 NOTE — Patient Instructions (Signed)
Thank you for choosing [facility] to provide your oncology and hematology care.  To afford each patient quality time with our providers, please arrive 30 minutes before your scheduled appointment time.  If you arrive late for your appointment, you may be asked to reschedule.  We strive to give you quality time with our providers, and arriving late affects you and other patients whose appointments are after yours.  If you are a no show for multiple scheduled visits, you may be dismissed from the clinic at the providers discretion.   Again, thank you for choosing [facility], our hope is that these requests will decrease the amount of time that you wait before being seen by our physicians.  ______________________________________________________________________ Should you have questions after your visit to the [facility], please contact our office at [phone] between the hours of 8:30 and 4:30 p.m.    Voicemails left after 4:30p.m will not be returned until the following business day.   For prescription refill requests, please have your pharmacy contact us directly.  Please also try to allow 48 hours for prescription requests.   Please contact the scheduling department for questions regarding scheduling.  For scheduling of procedures such as PET scans, CT scans, MRI, Ultrasound, etc please contact central scheduling at [phone].   Resources For Cancer Patients and Caregivers:  American Cancer Society:  800-227-2345  Can help patients locate various types of support and financial assistance Cancer Care: 1-800-813-HOPE (4673) Provides financial assistance, online support groups, medication/co-pay assistance.   [place] DSS:  336-641-3447 Where to apply for food stamps, Medicaid, and utility assistance Medicare Rights Center: 800-333-4114 Helps people with Medicare understand their rights and benefits, navigate the Medicare system, and secure the  quality healthcare they deserve SCAT: 336-333-6589 [place] [employer]'s shared-ride transportation service for eligible riders who have a disability that prevents them from riding the fixed route bus.   For additional information on assistance programs please contact our social worker:   Grier Hock/Abigail Elmore:  [phone] 

## 2016-12-28 NOTE — Progress Notes (Signed)
Marland Kitchen    HEMATOLOGY/ONCOLOGY CONSULTATION NOTE  Date of Service: 12/28/2016  Patient Care Team: Josetta Huddle, MD as PCP - General (Internal Medicine)  CHIEF COMPLAINTS/PURPOSE OF CONSULTATION:  M-spike on SPEP  HISTORY OF PRESENTING ILLNESS:    Timothy Stanton is a wonderful 76 y.o. male who has been referred to [place] by Dr .Josetta Huddle, MD  for evaluation and management of M-spike on SPEP.  Patient is an overall fairly healthy 76 year old gentleman with history of dyslipidemia, gout, erectile dysfunction presented with muscle twitching and cramping and hand weakness for the last couple of years and was referred to Dr. Rexene Alberts 02/18/2016 at [facility] neurology for further evaluation. He had no conduction studies/EMG on 03/27/2016 that showed diffuse motor neuron disease he was referred to Dr. Tedra Coupe at [facility] and was diagnosed with ALS. Other workup for nutritional deficiencies, infectious etiology , and inflammatory disorders was unrevealing. Patient has had no sensory deficits . Notes that he has been started on noninvasive mechanical ventilation at nighttime to help with his breathing . As a part of his workup he had an SPEP that his primary care physician on 05/07/2016 which showed an M spike of 0.4 . No IFE or SFLC done at this time.  His outside labs have not been concerning for anemia hypercalcemia or renal failure . He has had no new bone pains . No peripheral sensory symptoms . He notes that his symptoms of muscle weakness and fasciculations have been gradually progressing for the last few years from 2015 and noticed atrophy and right hand weakness in approximately June of 2017  Notes some element of depression and has been eating somewhat less. Is on amitriptyline as per Dr. Vallarie Mare.  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 He notes no acute new concerns. No new bone pains. Has some minimally changed weakness in his right lower extremity. Has been trying to  keep physically active. No other acute new symptoms. We discussed all his testing for his monoclonal paraproteinemia in details. His whole body skeletal survey was negative. He has no anemia. No hypercalcemia no renal failure. SPEP showed only 0.4 g/dL of monoclonal protein with a negative UPEP. He recently completed a trial for his ALS. Has been trying over-the-counter supplements.  MEDICAL HISTORY:  Past Medical History:  Diagnosis Date  . ED (erectile dysfunction)   . Gout   . Hypercholesteremia   . Medical history non-contributory   . Peyronie disease   . 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 JOINT REPLACEMENT Right 4 yrs ago  . right hip pinning   6 1/2 yrs ago    SOCIAL HISTORY: Social History   Social History  . Marital status: Married    Spouse name: N/A  . Number of children: 3  . Years of education: HS   Occupational History  . Retired    Social History Main Topics  . Smoking status: Former Research scientist (life sciences)  . Smokeless tobacco: Never Used     Comment: Quit [id number]  . Alcohol use Yes     Comment: wine 1 -2 glasses day  . Drug use: No  . Sexual activity: Not on file   Other Topics Concern  . Not on file   Social History Narrative  Drinks 7-10 cups of tea a week   Worked in the [employer] traveled to [place] and [place] for business .  FAMILY HISTORY: Family History  Problem Relation Age of Onset  . Mental illness Mother   . Renal Disease Father     ALLERGIES:  is allergic to penicillins.  MEDICATIONS:  Current Outpatient Prescriptions  Medication Sig Dispense Refill  . Alpha-Lipoic Acid 600 MG CAPS Take by mouth daily.    Marland Kitchen amitriptyline (ELAVIL) 50 MG tablet     . Cholecalciferol  (VITAMIN D3) 2000 units TABS Take by mouth.    . Multiple Vitamin (MULTIVITAMIN WITH MINERALS) TABS Take 1 tablet by mouth daily.    . riluzole (RILUTEK) 50 MG tablet Take 50 mg by mouth.     No current facility-administered medications for this visit.     REVIEW OF SYSTEMS:    10 Point review of Systems was done is negative except as noted above.  PHYSICAL EXAMINATION: ECOG PERFORMANCE STATUS: 2 - Symptomatic, <50% confined to bed  . Vitals:   12/28/16 0939  BP: (!) 150/77  Pulse: 90  Resp: 18  Temp: 98.3 F (36.8 C)   Filed Weights   12/28/16 0939  Weight: 176 lb 4.8 oz (80 kg)   .Body mass index is 25.3 kg/m.  GENERAL:alert, in no acute distress and comfortable SKIN: skin color, texture, turgor are normal, no rashes or significant lesions EYES: normal, conjunctiva are pink and non-injected, sclera clear OROPHARYNX:no exudate, no erythema and lips, buccal mucosa, and tongue normal  NECK: supple, no JVD, thyroid normal size, non-tender, without nodularity LYMPH:  no palpable lymphadenopathy in the cervical, axillary or inguinal LUNGS: clear to auscultation with normal respiratory effort HEART: regular rate & rhythm,  no murmurs and no lower extremity edema ABDOMEN: abdomen soft, non-tender, normoactive bowel sounds , No palpable hepatosplenomegaly  Musculoskeletal: no cyanosis of digits and no clubbing  PSYCH: alert & oriented x 3 with fluent speech NEURO: no focal motor/sensory deficits  LABORATORY DATA:  I have reviewed the data as listed  . CBC Latest Ref Rng & Units 06/29/2016 08/10/2008 08/09/2008  WBC 4.0 - 10.3 10e3/uL 7.4 9.6 8.1  Hemoglobin 13.0 - 17.1 g/dL 14.3 9.8 DELTA CHECK NOTED(L) 7.9 CRITICAL RESULT CALLED TO, READ BACK BY AND VERIFIED WITH: HOYLE,T RN 0700 08/09/08 SMITHJ(LL)  Hematocrit 38.4 - 49.9 % 42.9 28.9(L) 22.3(L)  Platelets 140 - 400 10e3/uL 236 157 158 DELTA CHECK NOTED    . CMP Latest Ref Rng & Units 06/29/2016 06/29/2016 08/10/2008    Glucose 70 - 140 mg/dl 97 - 128(H)  BUN 7.0 - 26.0 mg/dL 16.3 - 10  Creatinine 0.7 - 1.3 mg/dL 0.9 - 1.09  Sodium 136 - 145 mEq/L 140 - 132(L)  Potassium 3.5 - 5.1 mEq/L 4.3 - 4.3  Chloride 96 - 112 mEq/L - - 96  CO2 22 - 29 mEq/L 28 - 29  Calcium 8.4 - 10.4 mg/dL 9.7 - 8.2(L)  Total Protein 6.0 - 8.5 g/dL 7.7 7.1 -  Total Bilirubin 0.20 - 1.20 mg/dL 1.05 - -  Alkaline Phos 40 - 150 U/L 66 - -  AST 5 - 34 U/L 30 - -  ALT 0 - 55 U/L 31 - -   . Lab Results  Component Value Date   LDH 220 06/29/2016   Component     Latest Ref Rng & Units 06/29/2016 07/02/2016  WBC     4.0 - 10.3 10e3/uL 7.4   NEUT#  1.5 - 6.5 10e3/uL 5.9   Hemoglobin     13.0 - 17.1 g/dL 14.3   HCT     38.4 - 49.9 % 42.9   Platelets     140 - 400 10e3/uL 236   MCV     79.3 - 98.0 fL 93.3   MCH     27.2 - 33.4 pg 31.1   MCHC     32.0 - 36.0 g/dL 33.3   RBC     4.20 - 5.82 10e6/uL 4.60   RDW     11.0 - 14.6 % 12.8   lymph#     0.9 - 3.3 10e3/uL 0.7 (L)   MONO#     0.1 - 0.9 10e3/uL 0.6   Eosinophils Absolute     0.0 - 0.5 10e3/uL 0.2   Basophils Absolute     0.0 - 0.1 10e3/uL 0.0   NEUT%     39.0 - 75.0 % 79.6 (H)   LYMPH%     14.0 - 49.0 % 9.7 (L)   MONO%     0.0 - 14.0 % 7.7   EOS%     0.0 - 7.0 % 2.7   BASO%     0.0 - 2.0 % 0.3   Retic %     0.80 - 1.80 % 1.28   Retic Ct Abs     34.80 - 93.90 10e3/uL 58.88   Immature Retic Fract     3.00 - 10.60 % 5.30   Sodium     136 - 145 mEq/L 140   Potassium     3.5 - 5.1 mEq/L 4.3   Chloride     98 - 109 mEq/L 101   CO2     22 - 29 mEq/L 28   Glucose     70 - 140 mg/dl 97   BUN     7.0 - 26.0 mg/dL 16.3   Creatinine     0.7 - 1.3 mg/dL 0.9   Total Bilirubin     0.20 - 1.20 mg/dL 1.05   Alkaline Phosphatase     40 - 150 U/L 66   AST     5 - 34 U/L 30   ALT     0 - 55 U/L 31   Total Protein     6.4 - 8.3 g/dL 7.7   Albumin     3.5 - 5.0 g/dL 4.0   Calcium     8.4 - 10.4 mg/dL 9.7   Anion gap     3 - 11 mEq/L 10    EGFR     >90 ml/min/1.73 m2 79 (L)   IgG (Immunoglobin G), Serum     700 - 1600 mg/dL 1,173   IgA/Immunoglobulin A, Serum     61 - 437 mg/dL 152   IgM, Qn, Serum     15 - 143 mg/dL 39   Total Protein     6.0 - 8.5 g/dL 7.1   Albumin SerPl Elph-Mcnc     2.9 - 4.4 g/dL 3.9   Alpha 1     0.0 - 0.4 g/dL 0.2   Alpha2 Glob SerPl Elph-Mcnc     0.4 - 1.0 g/dL 0.7   B-Globulin SerPl Elph-Mcnc     0.7 - 1.3 g/dL 1.1   Gamma Glob SerPl Elph-Mcnc     0.4 - 1.8 g/dL 1.2   M Protein SerPl Elph-Mcnc     Not Observed g/dL 0.4 (H)   Globulin, Total  2.2 - 3.9 g/dL 3.2   Albumin/Glob SerPl     0.7 - 1.7 1.3   IFE 1      Comment   Please Note (HCV):      Comment   Protein Urine Random     Not Estab. mg/dL  12.0  Prot,24hr calculated     30 - 150 mg/24 hr  192 (H)  ALBUMIN, U     %  46.2  Alpha-1-Globulin, U     %  3.3  ALPHA-2-GLOBULIN, U     %  7.9  Beta Globulin, U     %  22.6  Gamma Globulin, U     %  20.1  M-spike, %     Not Observed %  Not Observed  Please Note:       Comment  Ig Kappa Free Light Chain     3.3 - 19.4 mg/L 14.7   Ig Lambda Free Light Chain     5.7 - 26.3 mg/L 11.7   Kappa/Lambda FluidC Ratio     0.26 - 1.65 1.26   LDH     125 - 245 U/L 220   Beta 2     0.6 - 2.4 mg/L 2.6 (H)   Sed Rate     0 - 30 mm/hr 12     RADIOGRAPHIC STUDIES: I have personally reviewed the radiological images as listed and agreed with the findings in the report. No results found.  ASSESSMENT & PLAN:   76 year old with   1) IgG Kappa Monoclonal gammopathy of undetermined significance . Patient has no anemia, hypercalcemia, renal insufficiency or focal bone pains to suggest multiple myeloma at this time . Also the amount of M spike is quite minimal at 0.4 g/dL on outside labs and remained unchanged on rpt labs with [place]. 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 ALS mimics workup has been negative to date.  PLAN -We discussed that his monoclonal gammopathy workup shows no evidence of multiple myeloma at this time. -Would not recommend bone marrow biopsy. -he has a clearly defined neurologic diagnosis of ALS and his presentation would be atypical for paraproteinemia neuropathy. -He does not wish to have scheduled follow-up with [place] for monitoring of his MGUS this is quite reasonable in the setting of a progressive degenerative motor neuron disease which makes the MGUS less of a priority. -Would recommend myeloma panel every 6 months with primary care physician. Kindly reconsult [place] of his M spike exceeds 1.5 g/dL or if he develops new anemia hypercalcemia renal failure. -Continue to stay physically active. -Continue follow-up with neurology at [facility].  Continue follow-up with primary care physician.   return to care with Dr. Irene Limbo on an as-needed basis if any new questions or concerns arise.     All of the patients questions were answered with apparent satisfaction. The patient knows to call the clinic with any problems, questions or concerns.  I spent 20 minutes counseling the patient face to face. The total time spent in the appointment was 25 minutes and more than 50% was on counseling and direct patient cares.    Sullivan Lone MD [facility]IV[facility] Hematology/Oncology Physician [facility]  (Office):       [phone] (Work cell):  [phone] (Fax):  [phone]

## 2016-12-28 NOTE — Telephone Encounter (Signed)
No additional appts scheduled - per 6/18 RTC with Dr Irene Limbo on an as needed basis

## 2017-01-18 DIAGNOSIS — L03032 Cellulitis of left toe: Secondary | ICD-10-CM | POA: Diagnosis not present

## 2017-01-18 DIAGNOSIS — B351 Tinea unguium: Secondary | ICD-10-CM | POA: Diagnosis not present

## 2017-01-18 DIAGNOSIS — M79675 Pain in left toe(s): Secondary | ICD-10-CM | POA: Diagnosis not present

## 2017-01-18 DIAGNOSIS — M7989 Other specified soft tissue disorders: Secondary | ICD-10-CM | POA: Diagnosis not present

## 2017-02-23 DIAGNOSIS — G1221 Amyotrophic lateral sclerosis: Secondary | ICD-10-CM | POA: Diagnosis not present

## 2017-02-23 DIAGNOSIS — Z1389 Encounter for screening for other disorder: Secondary | ICD-10-CM | POA: Diagnosis not present

## 2017-02-23 DIAGNOSIS — G4700 Insomnia, unspecified: Secondary | ICD-10-CM | POA: Diagnosis not present

## 2017-02-23 DIAGNOSIS — F322 Major depressive disorder, single episode, severe without psychotic features: Secondary | ICD-10-CM | POA: Diagnosis not present

## 2017-02-23 DIAGNOSIS — Z0000 Encounter for general adult medical examination without abnormal findings: Secondary | ICD-10-CM | POA: Diagnosis not present

## 2017-02-23 DIAGNOSIS — Z79899 Other long term (current) drug therapy: Secondary | ICD-10-CM | POA: Diagnosis not present

## 2017-02-23 DIAGNOSIS — D472 Monoclonal gammopathy: Secondary | ICD-10-CM | POA: Diagnosis not present

## 2017-02-24 DIAGNOSIS — Z9181 History of falling: Secondary | ICD-10-CM | POA: Diagnosis not present

## 2017-02-24 DIAGNOSIS — G1221 Amyotrophic lateral sclerosis: Secondary | ICD-10-CM | POA: Diagnosis not present

## 2017-02-24 DIAGNOSIS — Z87891 Personal history of nicotine dependence: Secondary | ICD-10-CM | POA: Diagnosis not present

## 2017-02-24 DIAGNOSIS — Z79899 Other long term (current) drug therapy: Secondary | ICD-10-CM | POA: Diagnosis not present

## 2017-04-21 DIAGNOSIS — Z23 Encounter for immunization: Secondary | ICD-10-CM | POA: Diagnosis not present

## 2017-06-02 DIAGNOSIS — R531 Weakness: Secondary | ICD-10-CM | POA: Diagnosis not present

## 2017-06-02 DIAGNOSIS — Z79899 Other long term (current) drug therapy: Secondary | ICD-10-CM | POA: Diagnosis not present

## 2017-06-02 DIAGNOSIS — G1221 Amyotrophic lateral sclerosis: Secondary | ICD-10-CM | POA: Diagnosis not present

## 2017-06-02 DIAGNOSIS — Z87891 Personal history of nicotine dependence: Secondary | ICD-10-CM | POA: Diagnosis not present

## 2017-06-02 DIAGNOSIS — R269 Unspecified abnormalities of gait and mobility: Secondary | ICD-10-CM | POA: Diagnosis not present

## 2017-08-12 DIAGNOSIS — H25013 Cortical age-related cataract, bilateral: Secondary | ICD-10-CM | POA: Diagnosis not present

## 2017-08-12 DIAGNOSIS — H35371 Puckering of macula, right eye: Secondary | ICD-10-CM | POA: Diagnosis not present

## 2017-08-12 DIAGNOSIS — H2512 Age-related nuclear cataract, left eye: Secondary | ICD-10-CM | POA: Diagnosis not present

## 2017-08-12 DIAGNOSIS — H5203 Hypermetropia, bilateral: Secondary | ICD-10-CM | POA: Diagnosis not present

## 2017-09-28 IMAGING — DX DG BONE SURVEY MET
10 series · 10 of 10 positions shown · non-contrast
Comparison: None.

CLINICAL DATA: Monoclonal gammopathy.  Myeloma staging.

EXAM:
METASTATIC BONE SURVEY

[skull lat]
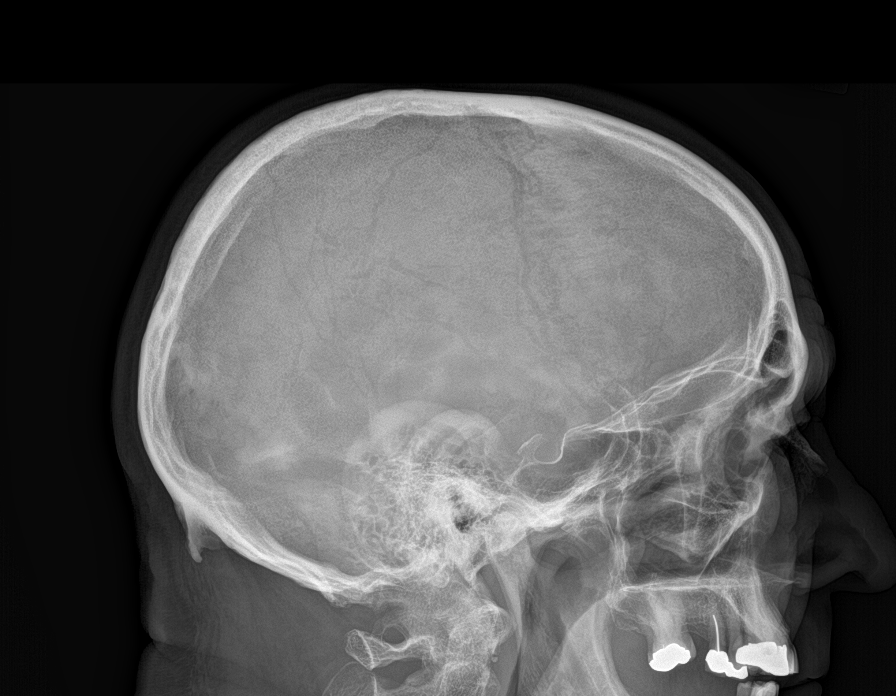

[shoulder ap (1 of 2)]
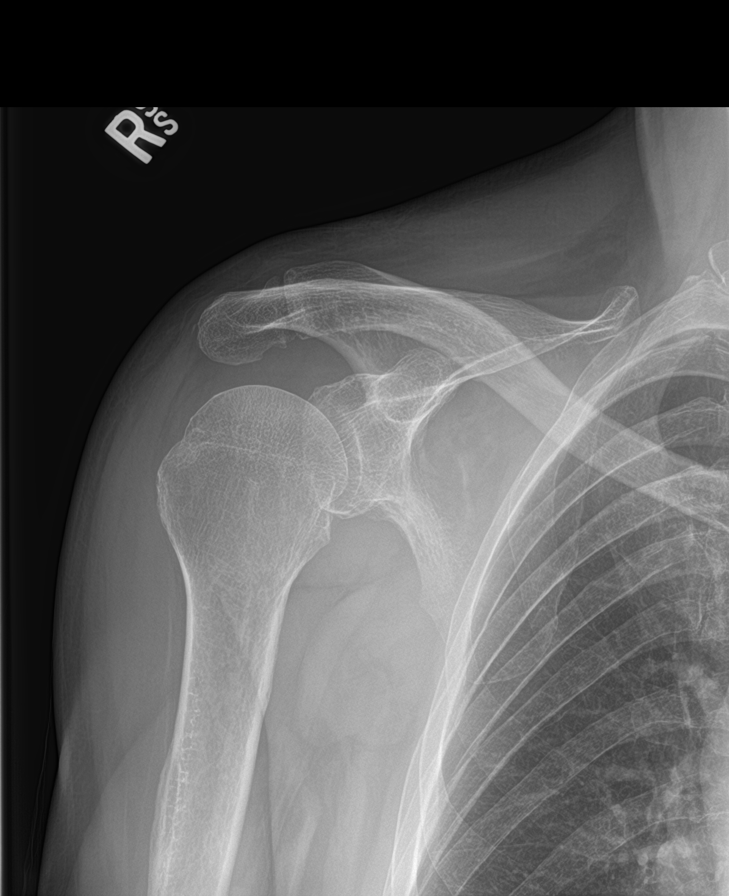

[shoulder ap (2 of 2)]
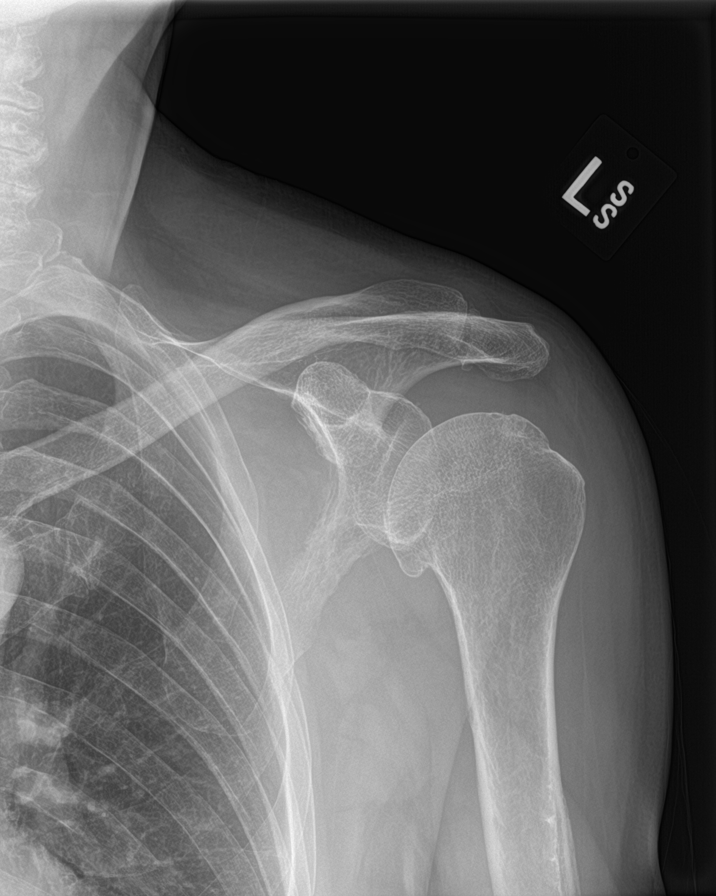

[humerus ap (1 of 2)]
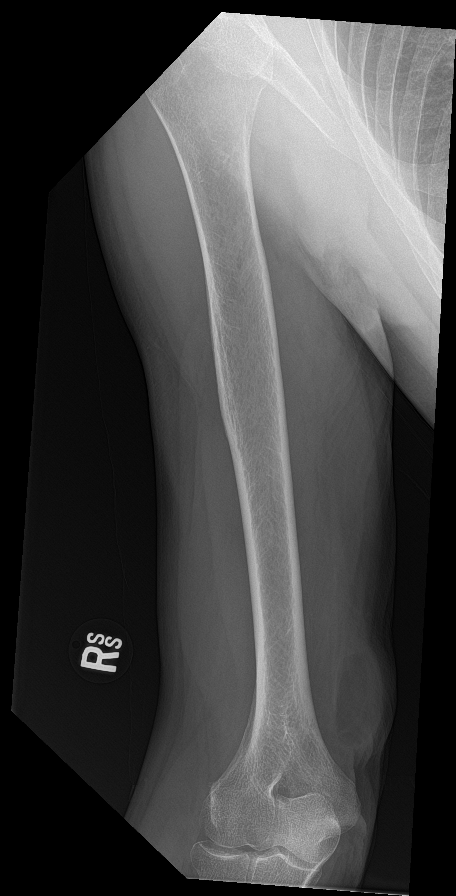

[humerus ap (2 of 2)]
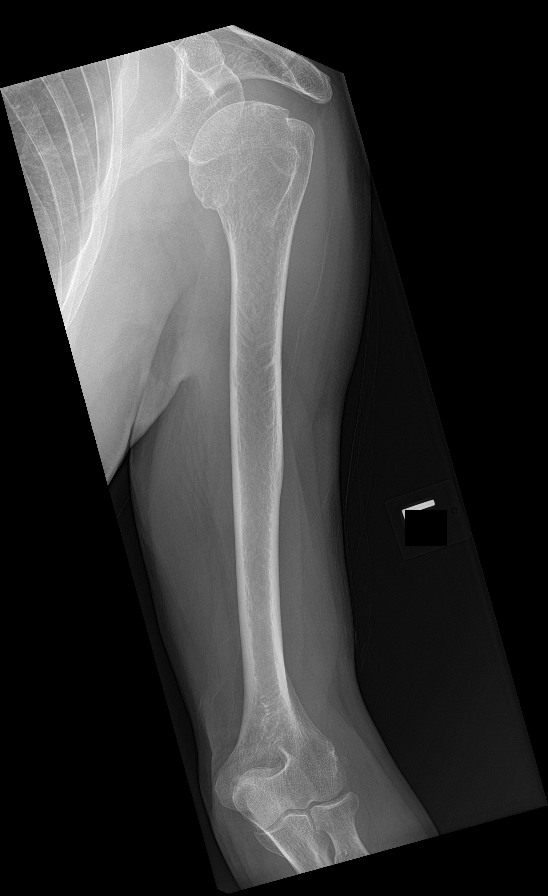

[forearm ap (1 of 2)]
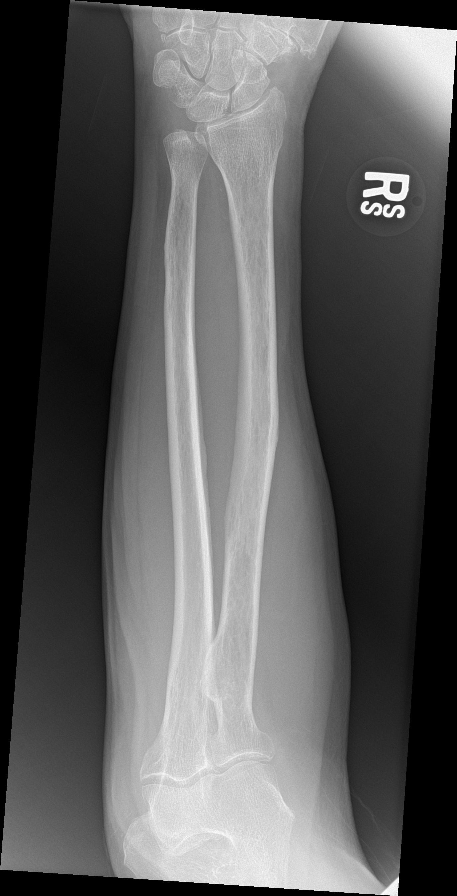

[forearm ap (2 of 2)]
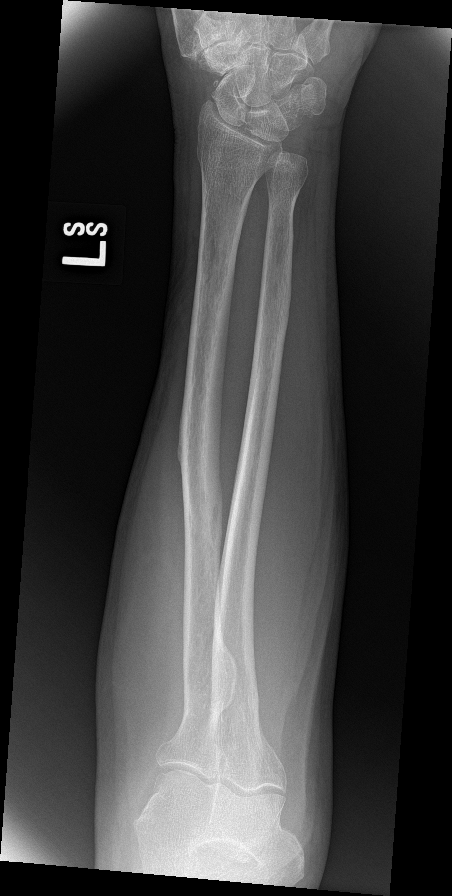

[c-spine ap]
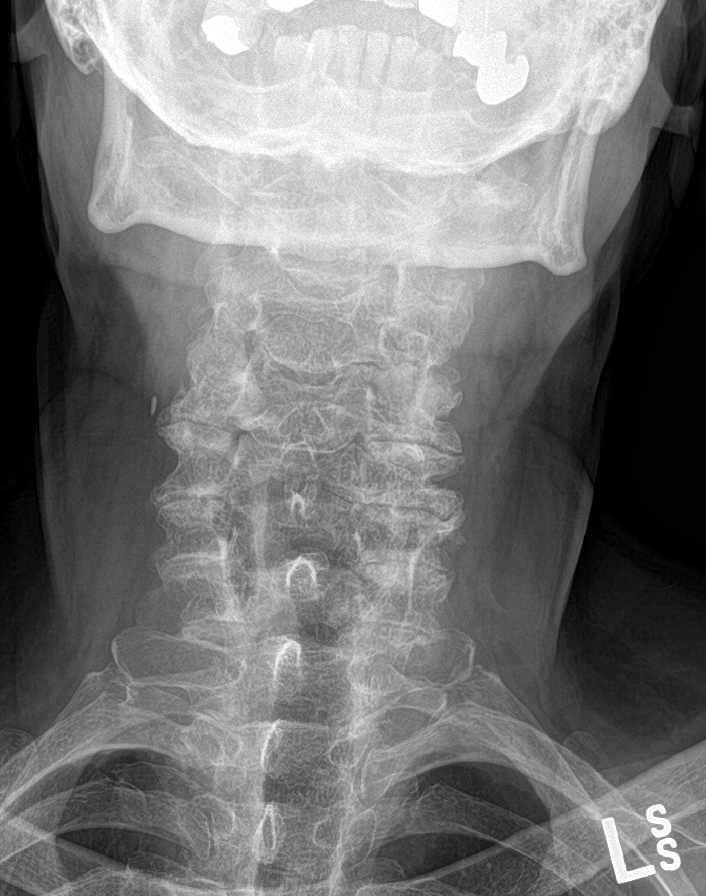

[c-spine lat]
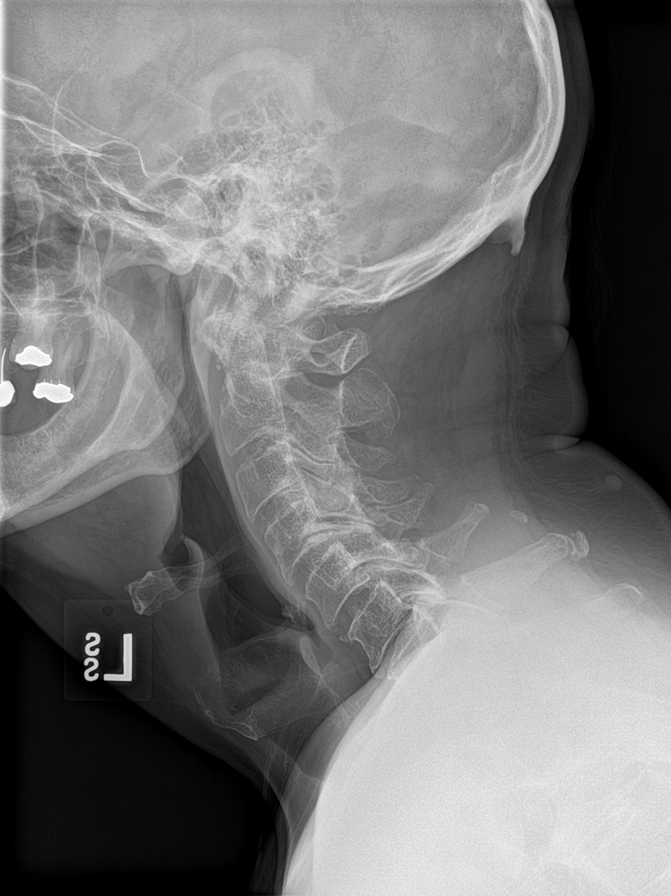

[t-spine ap]
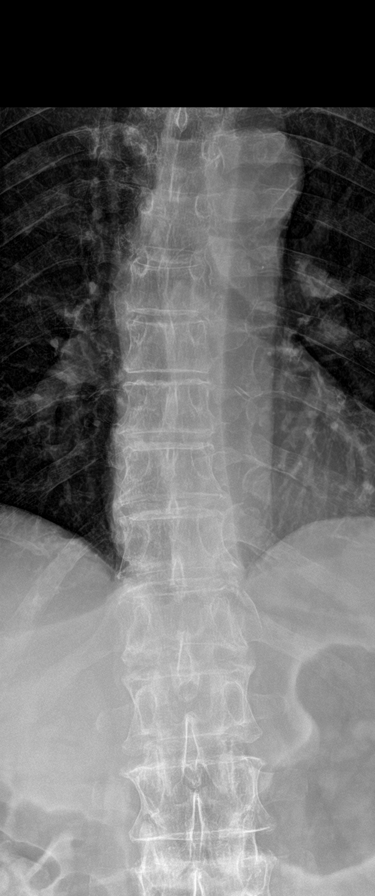

[10 of 10 positions shown; findings below may reference images not displayed]

FINDINGS: No evidence for lytic or sclerotic lesion in the calvarium. No lytic
lesions identified in either shoulder or upper extremity. Frontal
and lateral views of the cervical, thoracic, and lumbar spine show
no lytic or sclerotic lesions. Loss of disc height is seen at L4-5
with endplate degeneration. Bones are diffusely demineralized.

Frontal chest shows no unexpected rib abnormality. Symmetric nodular
densities overlying the lower lungs are compatible with nipple
shadows.

Frontal pelvis is unremarkable. Patient is status post right hip
replacement. Left hip unremarkable. No lytic or sclerotic
abnormality within either lower extremity.
IMPRESSION: No findings to suggest multiple myeloma.

## 2017-09-29 DIAGNOSIS — R531 Weakness: Secondary | ICD-10-CM | POA: Diagnosis not present

## 2017-09-29 DIAGNOSIS — G1221 Amyotrophic lateral sclerosis: Secondary | ICD-10-CM | POA: Diagnosis not present

## 2017-09-29 DIAGNOSIS — Z79899 Other long term (current) drug therapy: Secondary | ICD-10-CM | POA: Diagnosis not present

## 2017-09-29 DIAGNOSIS — R269 Unspecified abnormalities of gait and mobility: Secondary | ICD-10-CM | POA: Diagnosis not present

## 2017-12-23 DIAGNOSIS — M899 Disorder of bone, unspecified: Secondary | ICD-10-CM | POA: Diagnosis not present

## 2017-12-23 DIAGNOSIS — Z8781 Personal history of (healed) traumatic fracture: Secondary | ICD-10-CM | POA: Diagnosis not present

## 2017-12-23 DIAGNOSIS — F418 Other specified anxiety disorders: Secondary | ICD-10-CM | POA: Diagnosis not present

## 2017-12-23 DIAGNOSIS — F5101 Primary insomnia: Secondary | ICD-10-CM | POA: Diagnosis not present

## 2017-12-23 DIAGNOSIS — G1221 Amyotrophic lateral sclerosis: Secondary | ICD-10-CM | POA: Diagnosis not present

## 2017-12-30 DIAGNOSIS — M7989 Other specified soft tissue disorders: Secondary | ICD-10-CM | POA: Diagnosis not present

## 2017-12-30 DIAGNOSIS — F329 Major depressive disorder, single episode, unspecified: Secondary | ICD-10-CM | POA: Diagnosis not present

## 2017-12-30 DIAGNOSIS — Z79899 Other long term (current) drug therapy: Secondary | ICD-10-CM | POA: Diagnosis not present

## 2017-12-30 DIAGNOSIS — R2689 Other abnormalities of gait and mobility: Secondary | ICD-10-CM | POA: Diagnosis not present

## 2017-12-30 DIAGNOSIS — Z8781 Personal history of (healed) traumatic fracture: Secondary | ICD-10-CM | POA: Diagnosis not present

## 2017-12-30 DIAGNOSIS — M899 Disorder of bone, unspecified: Secondary | ICD-10-CM | POA: Diagnosis not present

## 2017-12-30 DIAGNOSIS — R269 Unspecified abnormalities of gait and mobility: Secondary | ICD-10-CM | POA: Diagnosis not present

## 2017-12-30 DIAGNOSIS — G1221 Amyotrophic lateral sclerosis: Secondary | ICD-10-CM | POA: Diagnosis not present

## 2017-12-30 DIAGNOSIS — M79672 Pain in left foot: Secondary | ICD-10-CM | POA: Diagnosis not present

## 2018-01-10 DIAGNOSIS — M10072 Idiopathic gout, left ankle and foot: Secondary | ICD-10-CM | POA: Diagnosis not present

## 2018-02-23 DIAGNOSIS — J40 Bronchitis, not specified as acute or chronic: Secondary | ICD-10-CM | POA: Diagnosis not present

## 2018-02-23 DIAGNOSIS — R0602 Shortness of breath: Secondary | ICD-10-CM | POA: Diagnosis not present

## 2018-02-23 DIAGNOSIS — J989 Respiratory disorder, unspecified: Secondary | ICD-10-CM | POA: Diagnosis not present

## 2018-03-16 DIAGNOSIS — M21371 Foot drop, right foot: Secondary | ICD-10-CM | POA: Diagnosis not present

## 2018-03-16 DIAGNOSIS — Z7409 Other reduced mobility: Secondary | ICD-10-CM | POA: Diagnosis not present

## 2018-03-16 DIAGNOSIS — Z79899 Other long term (current) drug therapy: Secondary | ICD-10-CM | POA: Diagnosis not present

## 2018-03-16 DIAGNOSIS — Z993 Dependence on wheelchair: Secondary | ICD-10-CM | POA: Diagnosis not present

## 2018-03-16 DIAGNOSIS — Z8781 Personal history of (healed) traumatic fracture: Secondary | ICD-10-CM | POA: Diagnosis not present

## 2018-03-16 DIAGNOSIS — G1221 Amyotrophic lateral sclerosis: Secondary | ICD-10-CM | POA: Diagnosis not present

## 2018-03-16 DIAGNOSIS — R296 Repeated falls: Secondary | ICD-10-CM | POA: Diagnosis not present

## 2018-03-16 DIAGNOSIS — R1319 Other dysphagia: Secondary | ICD-10-CM | POA: Diagnosis not present

## 2018-03-21 DIAGNOSIS — M816 Localized osteoporosis [Lequesne]: Secondary | ICD-10-CM | POA: Diagnosis not present

## 2018-03-21 DIAGNOSIS — Z0000 Encounter for general adult medical examination without abnormal findings: Secondary | ICD-10-CM | POA: Diagnosis not present

## 2018-05-03 DIAGNOSIS — Z23 Encounter for immunization: Secondary | ICD-10-CM | POA: Diagnosis not present

## 2018-05-17 DIAGNOSIS — H524 Presbyopia: Secondary | ICD-10-CM | POA: Diagnosis not present

## 2018-05-17 DIAGNOSIS — H35363 Drusen (degenerative) of macula, bilateral: Secondary | ICD-10-CM | POA: Diagnosis not present

## 2018-05-17 DIAGNOSIS — H04123 Dry eye syndrome of bilateral lacrimal glands: Secondary | ICD-10-CM | POA: Diagnosis not present

## 2018-05-17 DIAGNOSIS — H2512 Age-related nuclear cataract, left eye: Secondary | ICD-10-CM | POA: Diagnosis not present

## 2018-06-22 DIAGNOSIS — Z9181 History of falling: Secondary | ICD-10-CM | POA: Diagnosis not present

## 2018-06-22 DIAGNOSIS — Z7409 Other reduced mobility: Secondary | ICD-10-CM | POA: Diagnosis not present

## 2018-06-22 DIAGNOSIS — Z79899 Other long term (current) drug therapy: Secondary | ICD-10-CM | POA: Diagnosis not present

## 2018-06-22 DIAGNOSIS — G1221 Amyotrophic lateral sclerosis: Secondary | ICD-10-CM | POA: Diagnosis not present

## 2018-07-18 DIAGNOSIS — J9811 Atelectasis: Secondary | ICD-10-CM | POA: Diagnosis not present

## 2018-07-18 DIAGNOSIS — R05 Cough: Secondary | ICD-10-CM | POA: Diagnosis not present

## 2018-07-18 DIAGNOSIS — R918 Other nonspecific abnormal finding of lung field: Secondary | ICD-10-CM | POA: Diagnosis not present

## 2018-08-03 DIAGNOSIS — Z515 Encounter for palliative care: Secondary | ICD-10-CM | POA: Diagnosis not present

## 2018-08-03 DIAGNOSIS — F418 Other specified anxiety disorders: Secondary | ICD-10-CM | POA: Diagnosis not present

## 2018-08-03 DIAGNOSIS — G1221 Amyotrophic lateral sclerosis: Secondary | ICD-10-CM | POA: Diagnosis not present

## 2021-03-13 DEATH — deceased
